# Patient Record
Sex: Female | Born: 1990 | Hispanic: No | Marital: Single | Smoking: Never smoker
Health system: Southern US, Community
[De-identification: ages and names within clinical notes are randomized; demographics above are authoritative.]

## PROBLEM LIST (undated history)

## (undated) ENCOUNTER — Inpatient Hospital Stay (HOSPITAL_COMMUNITY): Payer: Self-pay

## (undated) DIAGNOSIS — R519 Headache, unspecified: Secondary | ICD-10-CM

## (undated) DIAGNOSIS — R51 Headache: Secondary | ICD-10-CM

## (undated) DIAGNOSIS — J45909 Unspecified asthma, uncomplicated: Secondary | ICD-10-CM

## (undated) DIAGNOSIS — O139 Gestational [pregnancy-induced] hypertension without significant proteinuria, unspecified trimester: Secondary | ICD-10-CM

## (undated) DIAGNOSIS — B999 Unspecified infectious disease: Secondary | ICD-10-CM

## (undated) DIAGNOSIS — Z8619 Personal history of other infectious and parasitic diseases: Secondary | ICD-10-CM

## (undated) DIAGNOSIS — F419 Anxiety disorder, unspecified: Secondary | ICD-10-CM

## (undated) HISTORY — DX: Personal history of other infectious and parasitic diseases: Z86.19

---

## 2015-03-15 ENCOUNTER — Emergency Department (HOSPITAL_COMMUNITY)
Admission: EM | Admit: 2015-03-15 | Discharge: 2015-03-15 | Disposition: A | Discharge status: Home / Self Care | Payer: Medicaid Other | Attending: Emergency Medicine | Admitting: Emergency Medicine

## 2015-03-15 ENCOUNTER — Encounter (HOSPITAL_COMMUNITY): Payer: Self-pay | Admitting: Emergency Medicine

## 2015-03-15 DIAGNOSIS — K5900 Constipation, unspecified: Secondary | ICD-10-CM | POA: Insufficient documentation

## 2015-03-15 DIAGNOSIS — N760 Acute vaginitis: Secondary | ICD-10-CM | POA: Insufficient documentation

## 2015-03-15 DIAGNOSIS — R1030 Lower abdominal pain, unspecified: Secondary | ICD-10-CM | POA: Diagnosis present

## 2015-03-15 DIAGNOSIS — J45909 Unspecified asthma, uncomplicated: Secondary | ICD-10-CM | POA: Insufficient documentation

## 2015-03-15 DIAGNOSIS — B9689 Other specified bacterial agents as the cause of diseases classified elsewhere: Secondary | ICD-10-CM

## 2015-03-15 HISTORY — DX: Unspecified asthma, uncomplicated: J45.909

## 2015-03-15 LAB — I-STAT BETA HCG BLOOD, ED (MC, WL, AP ONLY): I-stat hCG, quantitative: <5 m[IU]/mL (ref <5)

## 2015-03-15 LAB — WET PREP, GENITAL
Trich, Wet Prep: NONE SEEN
WBC, Wet Prep HPF POC: NONE SEEN
Yeast Wet Prep HPF POC: NONE SEEN

## 2015-03-15 MED ORDER — METRONIDAZOLE 500 MG PO TABS: 500.0000 mg | ORAL_TABLET | Freq: Two times a day (BID) | ORAL | Status: DC

## 2015-03-15 NOTE — ED Provider Notes (Signed)
CSN: [id number]     Arrival date & time 03/15/15  0827 History   First MD Initiated Contact with Patient 03/15/15 [phone]     Chief Complaint  Patient presents with  . Abdominal Cramping     (Consider location/radiation/quality/duration/timing/severity/associated sxs/prior Treatment) HPI   [id number] p/w lower abdominal cramping, abnormal vaginal discharge x 1 week.  The abdominal pain is cramping, comes and goes, 5/10 intensity, has taken no medications for this.  Discharge is white and malodorous.  No OTC medications tried.  LMP 4 weeks ago was on time and normal.  Has unprotected sex with one partner, is not concerned for STDs at this time.  Has otherwise been mildly constipated, last BM was yesterday and was hard.  Has had some hot flashes.  Has hx c-section, no other abdominal surgeries.  Denies fevers, vomiting, urinary symptoms.   Past Medical History  Diagnosis Date  . Asthma    Past Surgical History  Procedure Laterality Date  . Cesarean section     No family history on file. History  Substance Use Topics  . Smoking status: Never Smoker   . Smokeless tobacco: Not on file  . Alcohol Use: Yes   OB History    No data available     Review of Systems  All other systems reviewed and are negative.     Allergies  Review of patient's allergies indicates no known allergies.  Home Medications   Prior to Admission medications   Not on File   BP 145/80 mmHg  Pulse 87  Temp(Src) 98 F (36.7 C) (Oral)  Resp 18  Ht  (1.575 m)  Wt 150 lb (68.04 kg)  BMI 27.43 kg/m2  SpO2 100%  LMP 02/15/2015 (Approximate) Physical Exam  Constitutional: She appears well-developed and well-nourished. No distress.  HENT:  Head: Normocephalic and atraumatic.  Neck: Neck supple.  Cardiovascular: Normal rate and regular rhythm.   Pulmonary/Chest: Effort normal and breath sounds normal. No respiratory distress. She has no wheezes. She has no rales.  Abdominal: Soft. She exhibits no  distension. There is no tenderness. There is no rebound and no guarding.  Genitourinary: Cervix exhibits no motion tenderness. Right adnexum displays no mass, no tenderness and no fullness. Left adnexum displays no mass, no tenderness and no fullness. No erythema, tenderness or bleeding in the vagina. No foreign body around the vagina. No signs of injury around the vagina. Vaginal discharge found.  Small amount thick white malodorous discharge in vagina.    Neurological: She is alert.  Skin: She is not diaphoretic.  Nursing note and vitals reviewed.   ED Course  Procedures (including critical care time) Labs Review Labs Reviewed  WET PREP, GENITAL - Abnormal; Notable for the following:    Clue Cells Wet Prep HPF POC MODERATE (*)    All other components within normal limits  HIV ANTIBODY (ROUTINE TESTING)  RPR  I-STAT BETA HCG BLOOD, ED (MC, WL, AP ONLY)  GC/CHLAMYDIA PROBE AMP ([place]) NOT AT [facility]    Imaging Review No results found.   EKG Interpretation None      MDM   Final diagnoses:  Bacterial vaginosis    Afebrile, nontoxic patient with crampy lower abdominal pain and abnormal vaginal discharge.  Pt has low concern for STDs.  NO e/o PID on exam.  Wet prep c/w BV.   D/C home with flagyl, health dept follow up and resources.   Discussed result, findings, treatment, and follow up  with patient.  Pt  given return precautions.  Pt verbalizes understanding and agrees with plan.         Trixie Dredge, PA-C 03/15/15 1006  Rolland Porter, MD 03/25/15 [phone]

## 2015-03-15 NOTE — ED Notes (Addendum)
Pt c/o abdominal cramping x 1 week. Pt also reports nausea, vaginal discharge and odor. LMP about 4 weeks ago.

## 2015-03-15 NOTE — ED Notes (Signed)
Pt reports having positive pregnancy test at home 3 days ago.

## 2015-03-15 NOTE — Discharge Instructions (Signed)
Read the information below.  Use the prescribed medication as directed.  Please discuss all new medications with your pharmacist.  You may return to the Emergency Department at any time for worsening condition or any new symptoms that concern you.  If you develop high fevers, abdominal pain, uncontrolled vomiting, or are unable to tolerate fluids by mouth, return to the ER for a recheck.     Bacterial Vaginosis Bacterial vaginosis is a vaginal infection that occurs when the normal balance of bacteria in the vagina is disrupted. It results from an overgrowth of certain bacteria. This is the most common vaginal infection in women of childbearing age. Treatment is important to prevent complications, especially in pregnant women, as it can cause a premature delivery. CAUSES  Bacterial vaginosis is caused by an increase in harmful bacteria that are normally present in smaller amounts in the vagina. Several different kinds of bacteria can cause bacterial vaginosis. However, the reason that the condition develops is not fully understood. RISK FACTORS Certain activities or behaviors can put you at an increased risk of developing bacterial vaginosis, including:  Having a new sex partner or multiple sex partners.  Douching.  Using an intrauterine device (IUD) for contraception. Women do not get bacterial vaginosis from toilet seats, bedding, swimming pools, or contact with objects around them. SIGNS AND SYMPTOMS  Some women with bacterial vaginosis have no signs or symptoms. Common symptoms include:  Grey vaginal discharge.  A fishlike odor with discharge, especially after sexual intercourse.  Itching or burning of the vagina and vulva.  Burning or pain with urination. DIAGNOSIS  Your health care provider will take a medical history and examine the vagina for signs of bacterial vaginosis. A sample of vaginal fluid may be taken. Your health care provider will look at this sample under a microscope to  check for bacteria and abnormal cells. A vaginal pH test may also be done.  TREATMENT  Bacterial vaginosis may be treated with antibiotic medicines. These may be given in the form of a pill or a vaginal cream. A second round of antibiotics may be prescribed if the condition comes back after treatment.  HOME CARE INSTRUCTIONS   Only take over-the-counter or prescription medicines as directed by your health care provider.  If antibiotic medicine was prescribed, take it as directed. Make sure you finish it even if you start to feel better.  Do not have sex until treatment is completed.  Tell all sexual partners that you have a vaginal infection. They should see their health care provider and be treated if they have problems, such as a mild rash or itching.  Practice safe sex by using condoms and only having one sex partner. SEEK MEDICAL CARE IF:   Your symptoms are not improving after 3 days of treatment.  You have increased discharge or pain.  You have a fever. MAKE SURE YOU:   Understand these instructions.  Will watch your condition.  Will get help right away if you are not doing well or get worse. FOR MORE INFORMATION  Centers for Disease Control and Prevention, Division of STD Prevention: SolutionApps.co.za American Sexual Health Association (ASHA): www.ashastd.org  Document Released: 09/13/2005 Document Revised: 07/04/2013 Document Reviewed: 04/25/2013 [facility] Patient Information 2015 [place], [place]. This information is not intended to replace advice given to you by your health care provider. Make sure you discuss any questions you have with your health care provider.    Emergency Department Resource Guide 1) Find a Doctor and Pay Out of  Pocket Although you won't have to find out who is covered by your insurance plan, it is a good idea to ask around and get recommendations. You will then need to call the office and see if the doctor you have chosen will accept you as a new  patient and what types of options they offer for patients who are self-pay. Some doctors offer discounts or will set up payment plans for their patients who do not have insurance, but you will need to ask so you aren't surprised when you get to your appointment.  2) Contact Your Local Health Department Not all health departments have doctors that can see patients for sick visits, but many do, so it is worth a call to see if yours does. If you don't know where your local health department is, you can check in your phone book. The CDC also has a tool to help you locate your state's health department, and many state websites also have listings of all of their local health departments.  3) Find a Walk-in Clinic If your illness is not likely to be very severe or complicated, you may want to try a walk in clinic. These are popping up all over the country in pharmacies, drugstores, and shopping centers. They're usually staffed by nurse practitioners or physician assistants that have been trained to treat common illnesses and complaints. They're usually fairly quick and inexpensive. However, if you have serious medical issues or chronic medical problems, these are probably not your best option.  No Primary Care Doctor: - Call Health Connect at  [phone] - they can help you locate a primary care doctor that  accepts your insurance, provides certain services, etc. - Physician Referral Service- [phone]  Chronic Pain Problems: Organization         Address  Phone   Notes  [facility]  [phone] Patients need to be referred by their primary care doctor.   Medication Assistance: Organization         Address  Phone   Notes  [facility] Medication [facility] [address] [place] [place], [place] [address] [phone] --Must be a resident of [facility] -- Must have NO insurance coverage whatsoever (no Medicaid/ Medicare, etc.) -- The pt. MUST have a primary  care doctor that directs their care regularly and follows them in the community   [facility]ist  [phone]   [employer]  [phone]    Agencies that provide inexpensive medical care: Organization         Address  Phone   Notes  [facility]  [phone]   [facility]    [phone]   [facility] [address] [place], [place] [address] [phone]   [facility] [address], [place] [phone]   Planned Parenthood    [phone]   [facility]    [phone]   [facility]  [address], [place] Phone:  [phone], Fax:  [phone] Hours of Operation:  9 am - 6 pm, M-F.  Also accepts Medicaid/Medicare and self-pay.  [facility]  [address], [place] Phone: [phone], Fax: [phone]. Hours of Operation:  8:30 am - 5:30 pm, M-F.  Also accepts Medicaid and self-pay.  [facility] [place] [address], [employer] Phone: [phone]   [facility] [address] [place], [place] [phone], Ext. [phone] Mondays & Thursdays: 7-9 AM.  First 15 patients are seen on a first come, first serve basis.    Medicaid-accepting [facility] Providers:  Organization         Address  Phone   Notes  [facility] [address], [place] [phone] Also accepts self-pay patients.  [facility] [address] Laurell Josephs [place], [place]  [phone]   [facility] [address], [place] [phone]   [facility] [address], [place] [phone]   Renaye Rakers [address], [place]   [phone] Only accepts [place] patients after they have their name applied to their card.   Self-Pay (no insurance) in [facility]:  Organization         Address  Phone   Notes  Sickle Cell Patients, [facility] [address] [place], [place] [phone]   [facility] [address] [place], [place] [phone]   [facility] [place]  [address], [place] [phone]   [facility]/Dr. Osei-Bonsu  [address], [place] or [address], [place] [phone] Phone number for both [place] and [place] locations is the same.  [facility] [address], [place] [phone]   [facility] [address] [place] or [address] [phone]   [facility] [address] [place] [phone], phone; [phone], fax Sees patients 1st and 3rd Saturday of every month.  Must not qualify for public or private insurance (i.e. Medicaid, Medicare, [place] Health Choice, Veterans' Benefits)  Household income should be no more than 200% of the poverty level The clinic cannot treat you if you are pregnant or think you are pregnant  Sexually transmitted diseases are not treated at the clinic.    Dental Care: Organization         Address  Phone  Notes  [facility] of [facility] [address] [place], [place] [phone] Accepts children up to age 99 who are enrolled in [place] or [place] Health Choice; pregnant women with a Medicaid card; and children who have applied for Medicaid or [place] Health Choice, but were declined, whose parents can pay a reduced fee at time of service.  [facility] Department of [facility]  [address], [place] [phone] Accepts children up to age 5 who are enrolled in [place] or [place] Health Choice; pregnant women with a Medicaid card; and children who have applied for Medicaid or  Health Choice, but were declined, whose parents can  pay a reduced fee at time of service.  [facility]  [address] [place], [place] [phone] Patients are seen by appointment only. Walk-ins are not accepted. [facility] will see patients 67 years of age and older. Monday - Tuesday (8am-5pm) Most Wednesdays (8:30-5pm) $30 per visit, cash only  [facility] Adult Dental Access PRO[facility]  [address], [facility] [phone] Patients are seen by appointment only. Walk-ins are not accepted. [facility] will see patients 57 years of age and older. One Wednesday Evening (Monthly: Volunteer Based).  $30 per visit, cash only  [employer] of Dentistry Clinics  [phone] for adults; Children under age 47, call Graduate Pediatric Dentistry at [phone]. Children aged 63-14, please call [phone] to request a pediatric application.  Dental services are provided in all areas of dental care including fillings, crowns and bridges, complete and partial dentures, implants, gum treatment, root canals, and extractions. Preventive care is also provided. Treatment is provided to both adults and children. Patients are selected via a lottery and there is often a waiting list.   [facility] [address], [place]  [phone] [email or web address]   [facility] [address] [place], [place] [phone], Ext. [phone] Second and Fourth Thursday of each month, opens at 6:30 AM; Clinic ends at 9 AM.  Patients are seen on a first-come first-served basis, and a limited number are seen during each clinic.   [facility]  [address] Ether Griffins [place], [place] [phone]   Eligibility Requirements You must have lived in [place], [place], or [place] counties for at least the last three months.   You cannot be eligible for state or federal sponsored [place], including [employer], [place], or [employer].   You generally cannot be eligible for healthcare  insurance through your employer.    How to apply: Eligibility screenings are held every Tuesday and Wednesday afternoon from 1:00 pm until 4:00 pm. You do not need an appointment for the interview!  [facility] [address], [place], [place] [phone]   [facility] Health Department  [phone]   [facility] Health Department  [phone]   [facility] Health Department  [phone]    Behavioral Health Resources in the Community: Intensive Outpatient Programs Organization         Address  Phone  Notes  [facility] [address] [place], [place] [phone]   [facility] [address] [place], [place] [phone]   [facility] [address] [place], [place]  [phone]   [facility] [address],  [place], [place] [phone] or [phone]   Substance Abuse Resources Organization         Address  Phone  Notes  [facility] Drug Services  [phone]   [facility]  [phone]   [facility] [place]  [phone]   Floydene Flock  [phone]   Residential & Outpatient Substance Abuse Program  [phone]   Psychological Services Organization         Address  Phone  Notes  [facility]  [phone]   [facility]  [phone]   [facility] [address] [place] [phone] or [phone]    Mobile Crisis Teams Organization         Address  Phone  Notes  [facility], Mobile Crisis Care Unit  [phone]   [facility]  [address] [place], [place] [phone]   Doristine Locks [address] [place] [phone]    Self-Help/Support Groups Organization         Address  Phone             Notes  [facility] [place] - variety of support groups  [phone]- I7437963 Call for more information  Narcotics Anonymous (NA),  [facility] [address], [employer]  2 meetings at this location   Statistician  Address  Phone  Notes  [facility] [address],    [place]  [phone]   [facility]  [address], [place] [phone], [place], [place] [phone]   [facility] [address], [place] [phone] Admissions: 8am-3pm M-F  [facility] [address]    [place], [place] [phone]   [facility] [address], [place], [place] [phone]   [facility] [address]  [place], [place] [phone]   [facility] - Intensive Outpatient [address], [place], [place] [phone]   [facility] ([facility] [address]  [place], [place] [phone] or [phone]   [facility] ([facility]) [address] [place], [place] [phone] Accepts Medicaid  [facility] [address]  [place] [phone] Substance Abuse/Addiction Treatment   [facility] Behavioral Health Resources Organization         Address  Phone  Notes  [facility]  [phone]) [phone]   Angie FavaJulie Brannon, PhD [address] [place], [place]   [phone]) [phone] or [phone](336) [phone]   [facility]   [address] [place] [address] [place], [place] [phone](336) [phone]   [facility] [address], [place], [place] [phone]) [phone] Insurance/Medicaid/sponsorship through [facility]  [facility] [address]                                    [place], [place] [phone]) [phone] Therapy/tele-psych/case  [facility] [address].   [place], [place] [phone](336) [phone]    Dr. Lolly MustacheArfeen  [phone](336) [phone]   [facility]  [employer] [facility] 1) [address], [place] 2) [address], [place] 3)  [address], [place] [phone]) [phone]  [phone]) [phone]    [facility] Child Abuse Hotline [phone](336) [phone] or [phone](336) [phone] (After Hours)

## 2015-03-16 LAB — HIV ANTIBODY (ROUTINE TESTING W REFLEX): HIV Screen 4th Generation wRfx: NONREACTIVE

## 2015-03-16 LAB — RPR: RPR Ser Ql: NONREACTIVE

## 2015-03-17 LAB — GC/CHLAMYDIA PROBE AMP (~~LOC~~) NOT AT ARMC
Chlamydia: NEGATIVE
Neisseria Gonorrhea: NEGATIVE

## 2015-06-29 ENCOUNTER — Encounter (HOSPITAL_COMMUNITY): Payer: Self-pay | Admitting: Emergency Medicine

## 2015-06-29 ENCOUNTER — Emergency Department (HOSPITAL_COMMUNITY)
Admission: EM | Admit: 2015-06-29 | Discharge: 2015-06-29 | Disposition: A | Discharge status: Home / Self Care | Payer: Medicaid Other | Attending: Emergency Medicine | Admitting: Emergency Medicine

## 2015-06-29 DIAGNOSIS — J45901 Unspecified asthma with (acute) exacerbation: Secondary | ICD-10-CM | POA: Diagnosis not present

## 2015-06-29 DIAGNOSIS — J45990 Exercise induced bronchospasm: Secondary | ICD-10-CM

## 2015-06-29 DIAGNOSIS — Z760 Encounter for issue of repeat prescription: Secondary | ICD-10-CM | POA: Diagnosis not present

## 2015-06-29 DIAGNOSIS — Z79899 Other long term (current) drug therapy: Secondary | ICD-10-CM | POA: Insufficient documentation

## 2015-06-29 DIAGNOSIS — Z792 Long term (current) use of antibiotics: Secondary | ICD-10-CM | POA: Insufficient documentation

## 2015-06-29 DIAGNOSIS — J45909 Unspecified asthma, uncomplicated: Secondary | ICD-10-CM | POA: Diagnosis present

## 2015-06-29 MED ORDER — ALBUTEROL SULFATE HFA 108 (90 BASE) MCG/ACT IN AERS: 2.0000 | INHALATION_SPRAY | Freq: Four times a day (QID) | RESPIRATORY_TRACT | Status: AC | PRN

## 2015-06-29 NOTE — ED Notes (Signed)
Pt from home c/o shortness of breath when working out at the gym. She denies shortness of breath now. 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 Lung sounds clear bilaterally. She would like a prescription for an inhaler today.

## 2015-06-29 NOTE — ED Provider Notes (Signed)
CSN: [id number]     Arrival date & time 06/29/15  4098 History   First MD Initiated Contact with Patient 06/29/15 [phone]     Chief Complaint  Patient presents with  . Asthma  . Medication Refill    Patient is a 24 y.o. female presenting with asthma. The history is provided by the patient.  Asthma   patient presents to the emergency room with complaints of shortness of breath when exercising. Patient has a history of exercise-induced asthma. She says she's had this problem ever since she was child. She moved to [place] does not have a primary care doctor. She used to use an albuterol inhaler but does not have one right now. Yesterday when she was working out at [employer] she began to feel very short of breath. She felt like she was wheezing. Symptoms were severe and she felt like she was going to pass out. SYMPTOMS subsequently resolved with rest. She has not had nay shortness of breath since then. This morning she feels fine but she plans on going to the gym again and would like to get an albuterol inhaler. She denies any trouble with chest pain. She is not feeling short of breath now. She has not had any trouble with fevers or coughing. She does have a history of anxiety and sometimes feels short of breath when she gets anxious but she felt like she was wheezing yesterday at the gym.   Past Medical History  Diagnosis Date  . Asthma   No history of pulmonary embolism or DVT.  Past Surgical History  Procedure Laterality Date  . Cesarean section     No family history on file. Social History  Substance Use Topics  . Smoking status: Never Smoker   . Smokeless tobacco: None  . Alcohol Use: Yes   OB History    No data available     Review of Systems  Constitutional: Negative for fever.  Musculoskeletal:       No leg swelling  All other systems reviewed and are negative.     Allergies  Review of patient's allergies indicates no known allergies.  Home Medications   Prior to  Admission medications   Medication Sig Start Date End Date Taking? Authorizing Provider  albuterol (PROVENTIL HFA;VENTOLIN HFA) 108 (90 BASE) MCG/ACT inhaler Inhale 2 puffs into the lungs every 6 (six) hours as needed for wheezing or shortness of breath. 06/29/15   Linwood Dibbles, MD  metroNIDAZOLE (FLAGYL) 500 MG tablet Take 1 tablet (500 mg total) by mouth 2 (two) times daily. 03/15/15   Trixie Dredge, PA-C   BP 120/61 mmHg  Pulse 78  Temp(Src) 98.2 F (36.8 C) (Oral)  Resp 18  SpO2 100%  LMP 06/29/2015 Physical Exam  Constitutional: She appears well-developed and well-nourished. No distress.  HENT:  Head: Normocephalic and atraumatic.  Right Ear: External ear normal.  Left Ear: External ear normal.  Eyes: Conjunctivae are normal. Right eye exhibits no discharge. Left eye exhibits no discharge. No scleral icterus.  Neck: Neck supple. No tracheal deviation present.  Cardiovascular: Normal rate, regular rhythm and intact distal pulses.   Pulmonary/Chest: Effort normal and breath sounds normal. No stridor. No respiratory distress. She has no wheezes. She has no rales.  Abdominal: Soft. Bowel sounds are normal. She exhibits no distension. There is no tenderness. There is no rebound and no guarding.  Musculoskeletal: She exhibits no edema or tenderness.  Neurological: She is alert. She has normal strength. No cranial nerve  deficit (no facial droop, extraocular movements intact, no slurred speech) or sensory deficit. She exhibits normal muscle tone. She displays no seizure activity. Coordination normal.  Skin: Skin is warm and dry. No rash noted.  Psychiatric: She has a normal mood and affect.  Nursing note and vitals reviewed.   ED Course  Procedures (including critical care time) Labs Review Labs Reviewed - No data to display  Imaging Review No results found. I have personally reviewed and evaluated these images and lab results as part of my medical decision-making.   EKG  Interpretation None      MDM   Final diagnoses:  Exercise-induced asthma    Patient is asymptomatic today. She's not having any complaints of chest pain or shortness of breath. Her exam is normal. Vital signs show a normal oxygen saturation respiratory rate and pulse.  I will give her a prescription for an albuterol inhaler refill. Recommend she follow up with primary care doctor to establish care. She should return to the emergency room for worsening symptoms, chest pain or shortness of breath.    Linwood Dibbles, MD 06/29/15 [phone]

## 2015-06-29 NOTE — ED Notes (Signed)
Patient is not in any distress. Patient walked to the room from triage back to room 25 without any difficulty.

## 2015-06-29 NOTE — Discharge Instructions (Signed)
Exercise-Induced Bronchospasm A bronchospasm is a condition that is commonly caused by exercise, in which the muscles around the bronchioles (airways to the lungs) tighten, causing the airway to constrict. Exercise-induced bronchospasms are usually associated with short periods of vigorous activity. Many people who experience an exercise-induced bronchospasm may not notice at the time of the event; however, the athlete may later experience symptoms that negatively affect training and performance. SYMPTOMS   High-pitched sounds with breathing (wheezing).  Coughing.  Increased work of breathing (dyspnea).  Rapid breathing (hyperventilation).  Chest pain.  Symptoms occurring 4 to 6 hours after exercise is completed (late-phase reaction). CAUSES  It is not known why certain individuals experience bronchospasms. Respiratory specialists currently think that the cool or dry air breathed in may cause damage to the lining of the bronchioles, which elicits an inflammatory response. The inflammation causes the airways to narrow and symptoms then occur. RISK INCREASES WITH:  Viral infections.  Exercise in cold air.  Exercise in dry conditions.  Poor physical fitness.  High-intensity exercise.  No warm-up before play.  Frequent exposure to substances that produce allergic reactions (allergens). PREVENTION   Improve conditioning.  Treat allergies.  Breathe warm air (cover mouth and nose with a towel or scarf).  Warm up for an appropriate period of time before physical activity.  Gradually decrease intensity (warm down) for an appropriate period of time after physical activity. PROGNOSIS  Most people with exercise-induced asthma respond well to medication. Patients are typically prescribed an inhaler to treat bronchospasms. However, if symptoms persist despite treatment and continue to affect performance, individuals may need to consider avoiding activities that produce  symptoms. RELATED COMPLICATIONS   Decreased athletic performance.  Inability to condition as well as expected.  Side effects from medications. TREATMENT   Maintain physical fitness.  Run with a scarf or towel over your mouth in cold, dry air.  Complete at least 10 minutes of warm-up before high-intensity exercise.  Warm down after play.  Treat allergies. MEDICATION  The usual initial medication is an albuterol inhaler, which expands the constricted bronchioles.  The second-line medication is inhaled corticosteroids, which reduce inflammation in the airway.  Alternative medications included sodium cromoglycate and nedocromil inhalers.  Long-acting medications such as salmeterol can also be used as second-line medications. ACTIVITY  If medications are able to treat the offending symptoms, then no activity modification is required. If you know you will be training or competing in cold or dry climates take extra precautions to prevent symptoms. DIET  No specific diet is recommended.  SEEK MEDICAL CARE IF:   Greater than normal fatigue with exercise.  Greater than normal difficulty breathing occurs with exercise.  Increased wheezing with exercise.  You appear to be breathing harder and faster than expected with training.  Allergies appear to be uncontrollable.  You experience chest pain with exercise. Document Released: 09/13/2005 Document Revised: 01/28/2014 Document Reviewed: 12/26/2008 [facility] Patient Information 2015 [place], [place]. This information is not intended to replace advice given to you by your health care provider. Make sure you discuss any questions you have with your health care provider.

## 2015-06-30 ENCOUNTER — Encounter (HOSPITAL_COMMUNITY): Payer: Self-pay | Admitting: Emergency Medicine

## 2015-06-30 DIAGNOSIS — Z3202 Encounter for pregnancy test, result negative: Secondary | ICD-10-CM | POA: Diagnosis not present

## 2015-06-30 DIAGNOSIS — R51 Headache: Secondary | ICD-10-CM | POA: Insufficient documentation

## 2015-06-30 DIAGNOSIS — J45909 Unspecified asthma, uncomplicated: Secondary | ICD-10-CM | POA: Diagnosis not present

## 2015-06-30 DIAGNOSIS — Z79899 Other long term (current) drug therapy: Secondary | ICD-10-CM | POA: Insufficient documentation

## 2015-06-30 NOTE — ED Notes (Signed)
Pt reports headache with light sensitivity and nausea x 5 hours.

## 2015-06-30 NOTE — ED Notes (Signed)
Pt took tylenol 1 hour pta.

## 2015-07-01 ENCOUNTER — Emergency Department (HOSPITAL_COMMUNITY)
Admission: EM | Admit: 2015-07-01 | Discharge: 2015-07-01 | Disposition: A | Discharge status: Home / Self Care | Payer: Medicaid Other | Attending: Emergency Medicine | Admitting: Emergency Medicine

## 2015-07-01 DIAGNOSIS — R519 Headache, unspecified: Secondary | ICD-10-CM

## 2015-07-01 DIAGNOSIS — R51 Headache: Secondary | ICD-10-CM

## 2015-07-01 LAB — I-STAT BETA HCG BLOOD, ED (MC, WL, AP ONLY): I-stat hCG, quantitative: <5 m[IU]/mL (ref <5)

## 2015-07-01 MED ORDER — METOCLOPRAMIDE HCL 10 MG PO TABS
10.0000 mg | ORAL_TABLET | Freq: Once | ORAL | Status: AC
  Administered 2015-07-01: 10 mg via ORAL
  Filled 2015-07-01: qty 1

## 2015-07-01 MED ORDER — KETOROLAC TROMETHAMINE 60 MG/2ML IM SOLN
60.0000 mg | Freq: Once | INTRAMUSCULAR | Status: AC
  Administered 2015-07-01: 60 mg via INTRAMUSCULAR
  Filled 2015-07-01: qty 2

## 2015-07-01 MED ORDER — DIPHENHYDRAMINE HCL 25 MG PO CAPS
50.0000 mg | ORAL_CAPSULE | Freq: Once | ORAL | Status: AC
  Administered 2015-07-01: 50 mg via ORAL
  Filled 2015-07-01: qty 2

## 2015-07-01 MED ORDER — ALBUTEROL SULFATE HFA 108 (90 BASE) MCG/ACT IN AERS
2.0000 | INHALATION_SPRAY | Freq: Once | RESPIRATORY_TRACT | Status: AC
  Administered 2015-07-01: 2 via RESPIRATORY_TRACT
  Filled 2015-07-01: qty 6.7

## 2015-07-01 NOTE — Discharge Instructions (Signed)
General Headache Without Cause Ms. Vasques, take ibuprofen  every 8 hours as needed for headache.  See a primary care doctor within 3 days for close follow up.  If symptoms worsen, come back to the ED immediately. Thank you. A general headache is pain or discomfort felt around the head or neck area. The cause may not be found.  HOME CARE   Keep all doctor visits.  Only take medicines as told by your doctor.  Lie down in a dark, quiet room when you have a headache.  Keep a journal to find out if certain things bring on headaches. For example, write down:  What you eat and drink.  How much sleep you get.  Any change to your diet or medicines.  Relax by getting a massage or doing other relaxing activities.  Put ice or heat packs on the head and neck area as told by your doctor.  Lessen stress.  Sit up straight. Do not tighten (tense) your muscles.  Quit smoking if you smoke.  Lessen how much alcohol you drink.  Lessen how much caffeine you drink, or stop drinking caffeine.  Eat and sleep on a regular schedule.  Get 7 to 9 hours of sleep, or as told by your doctor.  Keep lights dim if bright lights bother you or make your headaches worse. GET HELP RIGHT AWAY IF:   Your headache becomes really bad.  You have a fever.  You have a stiff neck.  You have trouble seeing.  Your muscles are weak, or you lose muscle control.  You lose your balance or have trouble walking.  You feel like you will pass out (faint), or you pass out.  You have really bad symptoms that are different than your first symptoms.  You have problems with the medicines given to you by your doctor.  Your medicines do not work.  Your headache feels different than the other headaches.  You feel sick to your stomach (nauseous) or throw up (vomit). MAKE SURE YOU:   Understand these instructions.  Will watch your condition.  Will get help right away if you are not doing well or get  worse. Document Released: 06/22/2008 Document Revised: 12/06/2011 Document Reviewed: 09/03/2011 [facility] Patient Information 2015 [place], [place]. This information is not intended to replace advice given to you by your health care provider. Make sure you discuss any questions you have with your health care provider.

## 2015-07-01 NOTE — ED Provider Notes (Signed)
CSN: [id number]     Arrival date & time 06/30/15  2118 History  By signing my name below, I, Kim [name], attest that this documentation has been prepared under the direction and in the presence of Tomasita Crumble, MD. Electronically Signed: Sonum [name], Neurosurgeon. 07/01/2015. 2:29 AM.    Chief Complaint  Patient presents with  . Headache    The history is provided by the patient. No language interpreter was used.     HPI Comments: Kim [name] is a 24 y.o. female who presents to the Emergency Department complaining of a gradual onset, constant, unchanged HA that began several hours ago. She reports increased stress levels at home which she believes is related to her current symptoms. She has taken Tylenol without significant relief. She denies SOB, dizziness, lightheadedness. She denies any neurological symptoms. Patient has no further complaints.  Past Medical History  Diagnosis Date  . Asthma    Past Surgical History  Procedure Laterality Date  . Cesarean section     No family history on file. Social History  Substance Use Topics  . Smoking status: Never Smoker   . Smokeless tobacco: None  . Alcohol Use: Yes   OB History    No data available     Review of Systems  10 Systems reviewed and all are negative for acute change except as noted in the HPI.   Allergies  Review of patient's allergies indicates no known allergies.  Home Medications   Prior to Admission medications   Medication Sig Start Date End Date Taking? Authorizing Provider  albuterol (PROVENTIL HFA;VENTOLIN HFA) 108 (90 BASE) MCG/ACT inhaler Inhale 2 puffs into the lungs every 6 (six) hours as needed for wheezing or shortness of breath. 06/29/15  Yes Linwood Dibbles, MD   BP 133/91 mmHg  Pulse 73  Temp(Src) 98.6 F (37 C) (Oral)  Resp 16  Ht  (1.549 m)  Wt 155 lb 4.8 oz (70.444 kg)  BMI 29.36 kg/m2  SpO2 100%  LMP 06/29/2015 Physical Exam  Constitutional: She is oriented to person, place, and time. She  appears well-developed and well-nourished. No distress.  HENT:  Head: Normocephalic and atraumatic.  Nose: Nose normal.  Mouth/Throat: Oropharynx is clear and moist. No oropharyngeal exudate.  Eyes: Conjunctivae and EOM are normal. Pupils are equal, round, and reactive to light. No scleral icterus.  Neck: Normal range of motion. Neck supple. No JVD present. No tracheal deviation present. No thyromegaly present.  Cardiovascular: Normal rate, regular rhythm and normal heart sounds.  Exam reveals no gallop and no friction rub.   No murmur heard. Pulmonary/Chest: Effort normal and breath sounds normal. No respiratory distress. She has no wheezes. She exhibits no tenderness.  Abdominal: Soft. Bowel sounds are normal. She exhibits no distension and no mass. There is no tenderness. There is no rebound and no guarding.  Musculoskeletal: Normal range of motion. She exhibits no edema or tenderness.  Lymphadenopathy:    She has no cervical adenopathy.  Neurological: She is alert and oriented to person, place, and time. No cranial nerve deficit. She exhibits normal muscle tone. Coordination normal.  Normal strength and sensation in all 4 extremities. Normal cerebellar testing.   Skin: Skin is warm and dry. No rash noted. No erythema. No pallor.  Nursing note and vitals reviewed.   ED Course  Procedures (including critical care time)  DIAGNOSTIC STUDIES: Oxygen Saturation is 100% on RA, normal by my interpretation.    COORDINATION OF CARE: 12:25 AM Discussed treatment plan  with pt at bedside and pt agreed to plan.  2:20 AM Patient updated- Upon repeat evaluation, patient states her headache is gone. She was advised on ibuprofen at home as needed.  PCP fu was recommended.  She appears well and in NAD.  Her VS remain within her normal limits and she is safe for DC.   Labs Review Labs Reviewed - No data to display  Imaging Review No results found.    EKG Interpretation None      MDM    Final diagnoses:  None    Patient presents to the emergency department for headache which she states is consistent with her anxiety. She denies it being acute in onset.  I have low concern for serious intracranial pathology. Patient was given Toradol, Reglan, Benadryl for her symptoms. We'll observe the emergency department for improvement.   Patient feels better after medications. Encouraged to continue Motrin at home as needed. She appears well in acute distress, vital signs remain within her normal limits and she is safe for discharge.  I personally performed the services described in this documentation, which was scribed in my presence. The recorded information has been reviewed and is accurate.   Tomasita Crumble, MD 07/01/15 [phone]

## 2016-02-09 ENCOUNTER — Emergency Department (HOSPITAL_COMMUNITY): Payer: Self-pay

## 2016-02-09 ENCOUNTER — Encounter (HOSPITAL_COMMUNITY): Payer: Self-pay | Admitting: Emergency Medicine

## 2016-02-09 ENCOUNTER — Emergency Department (HOSPITAL_COMMUNITY)
Admission: EM | Admit: 2016-02-09 | Discharge: 2016-02-09 | Disposition: A | Discharge status: Home / Self Care | Payer: Self-pay | Attending: Emergency Medicine | Admitting: Emergency Medicine

## 2016-02-09 ENCOUNTER — Emergency Department (HOSPITAL_COMMUNITY)
Admission: EM | Admit: 2016-02-09 | Discharge: 2016-02-09 | Disposition: A | Discharge status: Home / Self Care | Payer: Medicaid Other | Attending: Dermatology | Admitting: Dermatology

## 2016-02-09 DIAGNOSIS — R109 Unspecified abdominal pain: Secondary | ICD-10-CM | POA: Insufficient documentation

## 2016-02-09 DIAGNOSIS — Z7951 Long term (current) use of inhaled steroids: Secondary | ICD-10-CM | POA: Insufficient documentation

## 2016-02-09 DIAGNOSIS — O26851 Spotting complicating pregnancy, first trimester: Secondary | ICD-10-CM | POA: Insufficient documentation

## 2016-02-09 DIAGNOSIS — J45909 Unspecified asthma, uncomplicated: Secondary | ICD-10-CM | POA: Insufficient documentation

## 2016-02-09 DIAGNOSIS — Z3A01 Less than 8 weeks gestation of pregnancy: Secondary | ICD-10-CM | POA: Insufficient documentation

## 2016-02-09 DIAGNOSIS — Z3A00 Weeks of gestation of pregnancy not specified: Secondary | ICD-10-CM | POA: Insufficient documentation

## 2016-02-09 DIAGNOSIS — O26891 Other specified pregnancy related conditions, first trimester: Secondary | ICD-10-CM | POA: Insufficient documentation

## 2016-02-09 DIAGNOSIS — O26859 Spotting complicating pregnancy, unspecified trimester: Secondary | ICD-10-CM | POA: Insufficient documentation

## 2016-02-09 DIAGNOSIS — N939 Abnormal uterine and vaginal bleeding, unspecified: Secondary | ICD-10-CM

## 2016-02-09 DIAGNOSIS — Z3490 Encounter for supervision of normal pregnancy, unspecified, unspecified trimester: Secondary | ICD-10-CM

## 2016-02-09 DIAGNOSIS — Z5321 Procedure and treatment not carried out due to patient leaving prior to being seen by health care provider: Secondary | ICD-10-CM | POA: Insufficient documentation

## 2016-02-09 LAB — I-STAT BETA HCG BLOOD, ED (MC, WL, AP ONLY)
I-stat hCG, quantitative: 1402.9 m[IU]/mL — ABNORMAL HIGH (ref <5)
I-stat hCG, quantitative: 1310.6 m[IU]/mL — ABNORMAL HIGH (ref <5)

## 2016-02-09 LAB — URINALYSIS, ROUTINE W REFLEX MICROSCOPIC
Bilirubin Urine: NEGATIVE
Glucose, UA: NEGATIVE mg/dL
HGB URINE DIPSTICK: NEGATIVE
KETONES UR: NEGATIVE mg/dL
LEUKOCYTES UA: NEGATIVE
Nitrite: NEGATIVE
Protein, ur: NEGATIVE mg/dL
Specific Gravity, Urine: 1.035 — ABNORMAL HIGH (ref 1.005–1.030)
pH: 6 (ref 5.0–8.0)

## 2016-02-09 LAB — CBC WITH DIFFERENTIAL/PLATELET
BASOS ABS: 0 10*3/uL (ref 0.0–0.1)
Basophils Relative: 1 %
Eosinophils Absolute: 0.1 10*3/uL (ref 0.0–0.7)
Eosinophils Relative: 1 %
HEMATOCRIT: 38.6 % (ref 36.0–46.0)
Hemoglobin: 13.2 g/dL (ref 12.0–15.0)
LYMPHS PCT: 39 %
Lymphs Abs: 2.9 10*3/uL (ref 0.7–4.0)
MCH: 28.4 pg (ref 26.0–34.0)
MCHC: 34.2 g/dL (ref 30.0–36.0)
MCV: 83 fL (ref 78.0–100.0)
Monocytes Absolute: 0.6 10*3/uL (ref 0.1–1.0)
Monocytes Relative: 8 %
NEUTROS ABS: 3.8 10*3/uL (ref 1.7–7.7)
NEUTROS PCT: 51 %
Platelets: 318 10*3/uL (ref 150–400)
RBC: 4.65 MIL/uL (ref 3.87–5.11)
RDW: 12.9 % (ref 11.5–15.5)
WBC: 7.3 10*3/uL (ref 4.0–10.5)

## 2016-02-09 LAB — WET PREP, GENITAL
Clue Cells Wet Prep HPF POC: NONE SEEN
Sperm: NONE SEEN
Trich, Wet Prep: NONE SEEN
Yeast Wet Prep HPF POC: NONE SEEN

## 2016-02-09 LAB — APTT: aPTT: 28 seconds (ref 24–37)

## 2016-02-09 LAB — PROTIME-INR
INR: 1.07 (ref 0.00–1.49)
PROTHROMBIN TIME: 14.1 s (ref 11.6–15.2)

## 2016-02-09 LAB — TYPE AND SCREEN
ABO/RH(D): O POS
Antibody Screen: NEGATIVE

## 2016-02-09 LAB — ABO/RH: ABO/RH(D): O POS

## 2016-02-09 NOTE — ED Notes (Signed)
Pt c/o light spotting and abdominal cramping since this morning. Pt reports having positive pregnancy test on Saturday.

## 2016-02-09 NOTE — ED Provider Notes (Signed)
CSN: [id number]     Arrival date & time 02/09/16  1404 History   First MD Initiated Contact with Patient 02/09/16 1613     Chief Complaint  Patient presents with  . Vaginal Bleeding     (Consider location/radiation/quality/duration/timing/severity/associated sxs/prior Treatment) Patient is a 25 y.o. female presenting with vaginal bleeding. The history is provided by the patient.  Vaginal Bleeding Quality:  Lighter than menses (light pink) Severity:  Mild Onset quality:  Sudden Duration:  8 hours Timing:  Intermittent Progression:  Unchanged Chronicity:  New Possible pregnancy: yes   Context: at rest   Relieved by:  Nothing Worsened by:  Nothing tried Ineffective treatments:  None tried Associated symptoms: abdominal pain and nausea   Associated symptoms: no dizziness, no dysuria, no fever and no vaginal discharge   Risk factors: prior miscarriage    Kim [name] is a [id number] 25 y.o. female with PMH significant for asthma who presents with sudden onset, intermittent, light pink vaginal bleeding since 8 AM this morning.  Patient reports she took a pregnancy test Saturday, and it was positive.  Denies passing of clots or tissue.  LMP January 11, 2016.  No prior treatment.  She states she has not used any pads or tampons because it has not been that heavy.  Denies fever, chills, syncope, lightheadedness, dizziness, CP, SOB, vomiting, diarrhea, urinary symptoms, or vaginal/pelvic pain.  She endorses some nausea.    Past Medical History  Diagnosis Date  . Asthma    Past Surgical History  Procedure Laterality Date  . Cesarean section     History reviewed. No pertinent family history. Social History  Substance Use Topics  . Smoking status: Never Smoker   . Smokeless tobacco: None  . Alcohol Use: Yes   OB History    No data available     Review of Systems  Constitutional: Negative for fever and chills.  Respiratory: Negative for shortness of breath.   Cardiovascular: Negative  for chest pain.  Gastrointestinal: Positive for nausea and abdominal pain. Negative for vomiting and diarrhea.  Genitourinary: Positive for vaginal bleeding. Negative for dysuria, hematuria, vaginal discharge, vaginal pain and pelvic pain.  Neurological: Negative for dizziness.  All other systems reviewed and are negative.     Allergies  Review of patient's allergies indicates no known allergies.  Home Medications   Prior to Admission medications   Medication Sig Start Date End Date Taking? Authorizing Provider  albuterol (PROVENTIL HFA;VENTOLIN HFA) 108 (90 BASE) MCG/ACT inhaler Inhale 2 puffs into the lungs every 6 (six) hours as needed for wheezing or shortness of breath. 06/29/15  Yes Linwood Dibbles, MD   BP 124/64 mmHg  Pulse 74  Temp(Src) 98.8 F (37.1 C) (Oral)  Resp 14  SpO2 100%  LMP 01/11/2016 Physical Exam  Constitutional: She is oriented to person, place, and time. She appears well-developed and well-nourished.  Non-toxic appearance. She does not have a sickly appearance. She does not appear ill.  HENT:  Head: Normocephalic and atraumatic.  Mouth/Throat: Oropharynx is clear and moist.  Eyes: Conjunctivae are normal. Pupils are equal, round, and reactive to light.  Neck: Normal range of motion. Neck supple.  Cardiovascular: Normal rate and regular rhythm.   Pulmonary/Chest: Effort normal and breath sounds normal. No accessory muscle usage or stridor. No respiratory distress. She has no wheezes. She has no rhonchi. She has no rales.  Abdominal: Soft. Bowel sounds are normal. She exhibits no distension. There is tenderness (mild ) in the suprapubic area. There  is no rebound and no guarding.  Genitourinary: Vagina normal and uterus normal. Cervix exhibits no motion tenderness, no discharge and no friability. Right adnexum displays no tenderness. Left adnexum displays no tenderness. No erythema, tenderness or bleeding in the vagina. No signs of injury around the vagina. No vaginal  discharge found.  Chaperone present. Cervical os closed. No blood in the vaginal vault. No discharge.  No bilateral adnexal tenderness.  Mild uterine tenderness.   Musculoskeletal: Normal range of motion.  Lymphadenopathy:    She has no cervical adenopathy.  Neurological: She is alert and oriented to person, place, and time.  Speech clear without dysarthria.  Skin: Skin is warm and dry.  Psychiatric: She has a normal mood and affect. Her behavior is normal.    ED Course  Procedures (including critical care time) Labs Review Labs Reviewed  WET PREP, GENITAL - Abnormal; Notable for the following:    WBC, Wet Prep HPF POC MODERATE (*)    All other components within normal limits  URINALYSIS, ROUTINE W REFLEX MICROSCOPIC (NOT AT [facility]) - Abnormal; Notable for the following:    APPearance CLOUDY (*)    Specific Gravity, Urine 1.035 (*)    All other components within normal limits  I-STAT BETA HCG BLOOD, ED ([facility], [facility], [facility] ONLY) - Abnormal; Notable for the following:    I-stat hCG, quantitative 1402.9 (*)    All other components within normal limits  I-STAT BETA HCG BLOOD, ED ([facility], [facility], [facility] ONLY) - Abnormal; Notable for the following:    I-stat hCG, quantitative 1310.6 (*)    All other components within normal limits  CBC WITH DIFFERENTIAL/PLATELET  APTT  PROTIME-INR  HIV ANTIBODY (ROUTINE TESTING)  RPR  TYPE AND SCREEN  ABO/RH  GC/CHLAMYDIA PROBE AMP ([place]) NOT AT [facility]    Imaging Review US Ob Comp Less 14 Wks  02/09/2016  CLINICAL DATA:  Abdominal discomfort, spotting affecting pregnancy in first trimester cramping ; beta HCG = 1311 EXAM: OBSTETRIC <14 WK [place] AND TRANSVAGINAL OB US TECHNIQUE: Both transabdominal and transvaginal ultrasound examinations were performed for complete evaluation of the gestation as well as the maternal uterus, adnexal regions, and pelvic cul-de-sac. Transvaginal technique was performed to assess early pregnancy. COMPARISON:  None. FINDINGS: Intrauterine  gestational sac: Questionable tiny gestational sac versus minimal endometrial fluid Yolk sac:  N/A Embryo:  N/A Cardiac Activity: N/A Heart Rate: N/A  bpm MSD: 1.9 mm greatest size  mm   CRL:    mm Subchorionic hemorrhage:  None Maternal uterus/adnexae: RIGHT ovary normal size and morphology 2.3 x 1.6 x 1.3 cm. LEFT ovary measures 2.8 x 2.7 x 2.8 cm and contains a small hemorrhagic corpus luteal cyst. Small amount of nonspecific free pelvic fluid. No additional adnexal masses. IMPRESSION: Questionable tiny gestational sac versus minimal endometrial fluid within uterus. In the absence of a definitive intrauterine pregnancy, ectopic pregnancy cannot be excluded. Recommend followup ultrasound and/or potentially serial quantitative beta HCG to definitively exclude ectopic pregnancy. Electronically Signed   By: Ulyses Southward M.D.   On: 02/09/2016 18:41   US Ob Transvaginal  02/09/2016  CLINICAL DATA:  Abdominal discomfort, spotting affecting pregnancy in first trimester cramping ; beta HCG = 1311 EXAM: OBSTETRIC <14 WK [place] AND TRANSVAGINAL OB US TECHNIQUE: Both transabdominal and transvaginal ultrasound examinations were performed for complete evaluation of the gestation as well as the maternal uterus, adnexal regions, and pelvic cul-de-sac. Transvaginal technique was performed to assess early pregnancy. COMPARISON:  None. FINDINGS: Intrauterine gestational sac:  Questionable tiny gestational sac versus minimal endometrial fluid Yolk sac:  N/A Embryo:  N/A Cardiac Activity: N/A Heart Rate: N/A  bpm MSD: 1.9 mm greatest size  mm   CRL:    mm Subchorionic hemorrhage:  None Maternal uterus/adnexae: RIGHT ovary normal size and morphology 2.3 x 1.6 x 1.3 cm. LEFT ovary measures 2.8 x 2.7 x 2.8 cm and contains a small hemorrhagic corpus luteal cyst. Small amount of nonspecific free pelvic fluid. No additional adnexal masses. IMPRESSION: Questionable tiny gestational sac versus minimal endometrial fluid within uterus. In the  absence of a definitive intrauterine pregnancy, ectopic pregnancy cannot be excluded. Recommend followup ultrasound and/or potentially serial quantitative beta HCG to definitively exclude ectopic pregnancy. Electronically Signed   By: Ulyses SouthwardMark  Boles M.D.   On: 02/09/2016 18:41   I have personally reviewed and evaluated these images and lab results as part of my medical decision-making.   EKG Interpretation None      MDM   Final diagnoses:  Pregnancy  Abdominal cramping  Vaginal spotting   Patient presents with light vaginal bleeding and abdominal cramping after positive pregnancy test.  VSS, NAD.  Abdomen with mild tenderness in suprapubic region.  Cervical os closed.  No blood in vaginal vault.  Labs without acute abnormalities.  Hcg 1402.9.  Will proceed with OB ultrasound to assess for IUP.  Possible gestational sac vs endometrial fluid.  Will have patient follow up with [facility] in 2 days for serial hcg monitoring.  Discussed return precautions.  Patient agrees and acknowledges the above plan for discharge.   Case has been discussed with Dr. Juleen ChinaKohut who agrees with the above plan for discharge.        Kim FowlerKayla Cheyna Retana, PA-C 02/09/16 Herbie [id number]  Raeford RazorStephen Kohut, MD 02/10/16 2003

## 2016-02-09 NOTE — ED Notes (Signed)
Pt ambulatory and independent at discharge.  

## 2016-02-09 NOTE — Discharge Instructions (Signed)
Your labs today are stable.  Your pelvic exam was normal and your cervix is closed.  Your are very early on in your pregnancy, and we were not able to see the gestational sac on ultrasound.  Therefore, it is VERY IMPORTANT that you follow up with [facility] in 2 days for repeat hcg levels to rule out ectopic pregnancy.  Return to the ED if you experience sudden severe abdominal pain, vaginal bleeding, or feel like you are about to pass out.   First Trimester of Pregnancy  The first trimester of pregnancy is from week 1 until the end of week 12 (months 1 through 3). A week after a sperm fertilizes an egg, the egg will implant on the wall of the uterus. This embryo will begin to develop into a baby. Genes from you and your partner are forming the baby. The female genes determine whether the baby is a boy or a girl. At 6-8 weeks, the eyes and face are formed, and the heartbeat can be seen on ultrasound. At the end of 12 weeks, all the baby's organs are formed.   Now that you are pregnant, you will want to do everything you can to have a healthy baby. Two of the most important things are to get good prenatal care and to follow your health care provider's instructions. Prenatal care is all the medical care you receive before the baby's birth. This care will help prevent, find, and treat any problems during the pregnancy and childbirth.  BODY CHANGES  Your body goes through many changes during pregnancy. The changes vary from woman to woman.  You may gain or lose a couple of pounds at first.  You may feel sick to your stomach (nauseous) and throw up (vomit). If the vomiting is uncontrollable, call your health care provider.  You may tire easily.  You may develop headaches that can be relieved by medicines approved by your health care provider.  You may urinate more often. Painful urination may mean you have a bladder infection.  You may develop heartburn as a result of your pregnancy.  You may develop  constipation because certain hormones are causing the muscles that push waste through your intestines to slow down.  You may develop hemorrhoids or swollen, bulging veins (varicose veins).  Your breasts may begin to grow larger and become tender. Your nipples may stick out more, and the tissue that surrounds them (areola) may become darker.  Your gums may bleed and may be sensitive to brushing and flossing.  Dark spots or blotches (chloasma, mask of pregnancy) may develop on your face. This will likely fade after the baby is born.  Your menstrual periods will stop.  You may have a loss of appetite.  You may develop cravings for certain kinds of food.  You may have changes in your emotions from day to day, such as being excited to be pregnant or being concerned that something may go wrong with the pregnancy and baby.  You may have more vivid and strange dreams.  You may have changes in your hair. These can include thickening of your hair, rapid growth, and changes in texture. Some women also have hair loss during or after pregnancy, or hair that feels dry or thin. Your hair will most likely return to normal after your baby is born. WHAT TO EXPECT AT YOUR PRENATAL VISITS  During a routine prenatal visit:  You will be weighed to make sure you and the baby are growing normally.  Your blood pressure will be taken.  Your abdomen will be measured to track your baby's growth.  The fetal heartbeat will be listened to starting around week 10 or 12 of your pregnancy.  Test results from any previous visits will be discussed. Your health care provider may ask you:  How you are feeling.  If you are feeling the baby move.  If you have had any abnormal symptoms, such as leaking fluid, bleeding, severe headaches, or abdominal cramping.  If you are using any tobacco products, including cigarettes, chewing tobacco, and electronic cigarettes.  If you have any questions. Other tests that may be performed during  your first trimester include:  Blood tests to find your blood type and to check for the presence of any previous infections. They will also be used to check for low iron levels (anemia) and Rh antibodies. Later in the pregnancy, blood tests for diabetes will be done along with other tests if problems develop.  Urine tests to check for infections, diabetes, or protein in the urine.  An ultrasound to confirm the proper growth and development of the baby.  An amniocentesis to check for possible genetic problems.  Fetal screens for spina bifida and Down syndrome.  You may need other tests to make sure you and the baby are doing well.  HIV (human immunodeficiency virus) testing. Routine prenatal testing includes screening for HIV, unless you choose not to have this test. HOME CARE INSTRUCTIONS  Medicines  Follow your health care provider's instructions regarding medicine use. Specific medicines may be either safe or unsafe to take during pregnancy.  Take your prenatal vitamins as directed.  If you develop constipation, try taking a stool softener if your health care provider approves. Diet  Eat regular, well-balanced meals. Choose a variety of foods, such as meat or vegetable-based protein, fish, milk and low-fat dairy products, vegetables, fruits, and whole grain breads and cereals. Your health care provider will help you determine the amount of weight gain that is right for you.  Avoid raw meat and uncooked cheese. These carry germs that can cause birth defects in the baby.  Eating four or five small meals rather than three large meals a day may help relieve nausea and vomiting. If you start to feel nauseous, eating a few soda crackers can be helpful. Drinking liquids between meals instead of during meals also seems to help nausea and vomiting.  If you develop constipation, eat more high-fiber foods, such as fresh vegetables or fruit and whole grains. Drink enough fluids to keep your urine clear or  pale yellow. Activity and Exercise  Exercise only as directed by your health care provider. Exercising will help you:  Control your weight.  Stay in shape.  Be prepared for labor and delivery. Experiencing pain or cramping in the lower abdomen or low back is a good sign that you should stop exercising. Check with your health care provider before continuing normal exercises.  Try to avoid standing for long periods of time. Move your legs often if you must stand in one place for a long time.  Avoid heavy lifting.  Wear low-heeled shoes, and practice good posture.  You may continue to have sex unless your health care provider directs you otherwise. Relief of Pain or Discomfort  Wear a good support bra for breast tenderness.  Take warm sitz baths to soothe any pain or discomfort caused by hemorrhoids. Use hemorrhoid cream if your health care provider approves.  Rest with your legs elevated  if you have leg cramps or low back pain.  If you develop varicose veins in your legs, wear support hose. Elevate your feet for 15 minutes, 3-4 times a day. Limit salt in your diet. Prenatal Care  Schedule your prenatal visits by the twelfth week of pregnancy. They are usually scheduled monthly at first, then more often in the last 2 months before delivery.  Write down your questions. Take them to your prenatal visits.  Keep all your prenatal visits as directed by your health care provider. Safety  Wear your seat belt at all times when driving.  Make a list of emergency phone numbers, including numbers for family, friends, the hospital, and police and fire departments. General Tips  Ask your health care provider for a referral to a local prenatal education class. Begin classes no later than at the beginning of month 6 of your pregnancy.  Ask for help if you have counseling or nutritional needs during pregnancy. Your health care provider can offer advice or refer you to specialists for help with various needs.    Do not use hot tubs, steam rooms, or saunas.  Do not douche or use tampons or scented sanitary pads.  Do not cross your legs for long periods of time.  Avoid cat litter boxes and soil used by cats. These carry germs that can cause birth defects in the baby and possibly loss of the fetus by miscarriage or stillbirth.  Avoid all smoking, herbs, alcohol, and medicines not prescribed by your health care provider. Chemicals in these affect the formation and growth of the baby.  Do not use any tobacco products, including cigarettes, chewing tobacco, and electronic cigarettes. If you need help quitting, ask your health care provider. You may receive counseling support and other resources to help you quit.  Schedule a dentist appointment. At home, brush your teeth with a soft toothbrush and be gentle when you floss. SEEK MEDICAL CARE IF:  You have dizziness.  You have mild pelvic cramps, pelvic pressure, or nagging pain in the abdominal area.  You have persistent nausea, vomiting, or diarrhea.  You have a bad smelling vaginal discharge.  You have pain with urination.  You notice increased swelling in your face, hands, legs, or ankles. SEEK IMMEDIATE MEDICAL CARE IF:  You have a fever.  You are leaking fluid from your vagina.  You have spotting or bleeding from your vagina.  You have severe abdominal cramping or pain.  You have rapid weight gain or loss.  You vomit blood or material that looks like coffee grounds.  You are exposed to [place] measles and have never had them.  You are exposed to fifth disease or chickenpox.  You develop a severe headache.  You have shortness of breath.  You have any kind of trauma, such as from a fall or a car accident. This information is not intended to replace advice given to you by your health care provider. Make sure you discuss any questions you have with your health care provider.  Document Released: 09/07/2001 Document Revised: 10/04/2014 Document Reviewed:  07/24/2013  Elsevier Interactive Patient Education [employer]

## 2016-02-09 NOTE — Progress Notes (Signed)
Patient listed as not having a pcp or insurance.  EDCM spoke to patient at bedside.  Patient reports she has [employer] insurance and [facility]  System updated.

## 2016-02-09 NOTE — ED Notes (Addendum)
Pt stated to Nurse First that she must go and pick up her daughter. Pt unable to wait any longer.

## 2016-02-09 NOTE — ED Notes (Signed)
Ultrasound at bedside

## 2016-02-09 NOTE — ED Notes (Signed)
Pt c/o vaginal bleeding and abdominal cramps since this morning. Pt reports having a positive pregnancy test on Saturday. Denies N/V/D.

## 2016-02-10 LAB — GC/CHLAMYDIA PROBE AMP (~~LOC~~) NOT AT ARMC
Chlamydia: NEGATIVE
NEISSERIA GONORRHEA: NEGATIVE

## 2016-02-10 LAB — RPR: RPR: NONREACTIVE

## 2016-02-10 LAB — HIV ANTIBODY (ROUTINE TESTING W REFLEX): HIV SCREEN 4TH GENERATION: NONREACTIVE

## 2016-02-18 ENCOUNTER — Other Ambulatory Visit: Payer: Medicaid Other

## 2016-02-21 ENCOUNTER — Inpatient Hospital Stay (HOSPITAL_COMMUNITY)
Admission: AD | Admit: 2016-02-21 | Discharge: 2016-02-21 | Disposition: A | Discharge status: Home / Self Care | Payer: Medicaid Other | Source: Ambulatory Visit | Attending: Family Medicine | Admitting: Family Medicine

## 2016-02-21 ENCOUNTER — Encounter (HOSPITAL_COMMUNITY): Payer: Self-pay | Admitting: *Deleted

## 2016-02-21 DIAGNOSIS — O3680X0 Pregnancy with inconclusive fetal viability, not applicable or unspecified: Secondary | ICD-10-CM

## 2016-02-21 DIAGNOSIS — Z3A01 Less than 8 weeks gestation of pregnancy: Secondary | ICD-10-CM | POA: Insufficient documentation

## 2016-02-21 DIAGNOSIS — O210 Mild hyperemesis gravidarum: Secondary | ICD-10-CM | POA: Insufficient documentation

## 2016-02-21 DIAGNOSIS — O219 Vomiting of pregnancy, unspecified: Secondary | ICD-10-CM

## 2016-02-21 LAB — URINALYSIS, ROUTINE W REFLEX MICROSCOPIC
BILIRUBIN URINE: NEGATIVE
GLUCOSE, UA: NEGATIVE mg/dL
Hgb urine dipstick: NEGATIVE
KETONES UR: NEGATIVE mg/dL
Leukocytes, UA: NEGATIVE
Nitrite: NEGATIVE
PH: 7 (ref 5.0–8.0)
Protein, ur: NEGATIVE mg/dL
Specific Gravity, Urine: 1.01 (ref 1.005–1.030)

## 2016-02-21 LAB — HCG, QUANTITATIVE, PREGNANCY: hCG, Beta Chain, Quant, S: 58323 m[IU]/mL — ABNORMAL HIGH (ref <5)

## 2016-02-21 MED ORDER — METOCLOPRAMIDE HCL 10 MG PO TABS: 10.0000 mg | ORAL_TABLET | Freq: Four times a day (QID) | ORAL | Status: DC | PRN

## 2016-02-21 MED ORDER — PROMETHAZINE HCL 12.5 MG PO TABS: 12.5000 mg | ORAL_TABLET | Freq: Once | ORAL | Status: DC

## 2016-02-21 MED ORDER — METOCLOPRAMIDE HCL 10 MG PO TABS
10.0000 mg | ORAL_TABLET | Freq: Once | ORAL | Status: AC
  Administered 2016-02-21: 10 mg via ORAL

## 2016-02-21 MED ORDER — PROMETHAZINE HCL 25 MG PO TABS: 12.5000 mg | ORAL_TABLET | Freq: Once | ORAL | Status: DC

## 2016-02-21 NOTE — MAU Note (Signed)
Pt states she can't keep anything down and it has been going on for 4-5 days.  She states she can't work and is stuck at home lying in the bed.

## 2016-02-21 NOTE — MAU Provider Note (Signed)
First Provider Initiated Contact with Patient 02/21/16 1810      Chief Complaint:  Emesis   Kim [name] is  25 y.o. [phone] at 7447w6d presents complaining of Emesis She has not felt good for 3-4 days, does not feel dehydrated.  Had ED visit 12 days ago, was supposed to FU for repeat hcg, did not come. Has to switch FP medicaid to pregnancy before she can get an OB appt.  Denies any bleeding or pain.   Obstetrical/Gynecological History: OB History    Gravida Para Term Preterm AB TAB SAB Ectopic Multiple Living   4 1 1  2 2    1      Past Medical History: Past Medical History  Diagnosis Date  . Asthma     Past Surgical History: Past Surgical History  Procedure Laterality Date  . Cesarean section      Family History: History reviewed. No pertinent family history.  Social History: Social History  Substance Use Topics  . Smoking status: Never Smoker   . Smokeless tobacco: None  . Alcohol Use: Yes    Allergies: No Known Allergies  Meds:  Prescriptions prior to admission  Medication Sig Dispense Refill Last Dose  . albuterol (PROVENTIL HFA;VENTOLIN HFA) 108 (90 BASE) MCG/ACT inhaler Inhale 2 puffs into the lungs every 6 (six) hours as needed for wheezing or shortness of breath. 1 Inhaler 2 Past Month at Unknown time    Review of Systems   Constitutional: Negative for fever and chills Eyes: Negative for visual disturbances Respiratory: Negative for shortness of breath, dyspnea Cardiovascular: Negative for chest pain or palpitations  Gastrointestinal: Negative for vomiting, diarrhea and constipation Genitourinary: Negative for dysuria and urgency Musculoskeletal: Negative for back pain, joint pain, myalgias.  Normal ROM  Neurological: Negative for dizziness and headaches    Physical Exam  Blood pressure 122/69, pulse 78, temperature 98.5 F (36.9 C), temperature source Oral, resp. rate 16, height 5\' 2"  (1.575 m), weight 70.308 kg (155 lb), last menstrual period  01/11/2016. GENERAL: Well-developed, well-nourished female in no acute distress.  LUNGS: Clear to auscultation bilaterally.  HEART: Regular rate and rhythm. ABDOMEN: Soft, nontender, nondistended, gravid.  EXTREMITIES: Nontender, no edema, 2+ distal pulses. DTR's 2+   Labs: Results for orders placed or performed during the hospital encounter of 02/21/16 (from the past 24 hour(s))  Urinalysis, Routine w reflex microscopic (not [facility])   Collection Time: 02/21/16  4:35 PM  Result Value Ref Range   Color, Urine YELLOW YELLOW   APPearance HAZY (A) CLEAR   Specific Gravity, Urine 1.010 1.005 - 1.030   pH 7.0 5.0 - 8.0   Glucose, UA NEGATIVE NEGATIVE mg/dL   Hgb urine dipstick NEGATIVE NEGATIVE   Bilirubin Urine NEGATIVE NEGATIVE   Ketones, ur NEGATIVE NEGATIVE mg/dL   Protein, ur NEGATIVE NEGATIVE mg/dL   Nitrite NEGATIVE NEGATIVE   Leukocytes, UA NEGATIVE NEGATIVE   Imaging Studies:  [place] Ob Comp Less 14 Wks  02/09/2016  CLINICAL DATA:  Abdominal discomfort, spotting affecting pregnancy in first trimester cramping ; beta HCG = 1311 EXAM: OBSTETRIC <14 WK US AND TRANSVAGINAL OB US TECHNIQUE: Both transabdominal and transvaginal ultrasound examinations were performed for complete evaluation of the gestation as well as the maternal uterus, adnexal regions, and pelvic cul-de-sac. Transvaginal technique was performed to assess early pregnancy. COMPARISON:  None. FINDINGS: Intrauterine gestational sac: Questionable tiny gestational sac versus minimal endometrial fluid Yolk sac:  N/A Embryo:  N/A Cardiac Activity: N/A Heart Rate: N/A  bpm MSD:  1.9 mm greatest size  mm   CRL:    mm Subchorionic hemorrhage:  None Maternal uterus/adnexae: RIGHT ovary normal size and morphology 2.3 x 1.6 x 1.3 cm. LEFT ovary measures 2.8 x 2.7 x 2.8 cm and contains a small hemorrhagic corpus luteal cyst. Small amount of nonspecific free pelvic fluid. No additional adnexal masses. IMPRESSION: Questionable tiny gestational  sac versus minimal endometrial fluid within uterus. In the absence of a definitive intrauterine pregnancy, ectopic pregnancy cannot be excluded. Recommend followup ultrasound and/or potentially serial quantitative beta HCG to definitively exclude ectopic pregnancy. Electronically Signed   By: Ulyses Southward M.D.   On: 02/09/2016 18:41   US Ob Transvaginal  02/09/2016  CLINICAL DATA:  Abdominal discomfort, spotting affecting pregnancy in first trimester cramping ; beta HCG = 1311 EXAM: OBSTETRIC <14 WK [place] AND TRANSVAGINAL OB US TECHNIQUE: Both transabdominal and transvaginal ultrasound examinations were performed for complete evaluation of the gestation as well as the maternal uterus, adnexal regions, and pelvic cul-de-sac. Transvaginal technique was performed to assess early pregnancy. COMPARISON:  None. FINDINGS: Intrauterine gestational sac: Questionable tiny gestational sac versus minimal endometrial fluid Yolk sac:  N/A Embryo:  N/A Cardiac Activity: N/A Heart Rate: N/A  bpm MSD: 1.9 mm greatest size  mm   CRL:    mm Subchorionic hemorrhage:  None Maternal uterus/adnexae: RIGHT ovary normal size and morphology 2.3 x 1.6 x 1.3 cm. LEFT ovary measures 2.8 x 2.7 x 2.8 cm and contains a small hemorrhagic corpus luteal cyst. Small amount of nonspecific free pelvic fluid. No additional adnexal masses. IMPRESSION: Questionable tiny gestational sac versus minimal endometrial fluid within uterus. In the absence of a definitive intrauterine pregnancy, ectopic pregnancy cannot be excluded. Recommend followup ultrasound and/or potentially serial quantitative beta HCG to definitively exclude ectopic pregnancy. Electronically Signed   By: Ulyses Southward M.D.   On: 02/09/2016 18:41    Assessment: Kim [name] is  25 y.o. [id number] at [redacted]w[redacted]d presents with nausea and vomiting, improved.  No vomiting at all in MAU  Plan: FU for outpt [place] Rx antiemetics  CRESENZO-DISHMAN,Wade Sigala 5/27/20178:07 PM

## 2016-02-21 NOTE — MAU Note (Signed)
Pt [facility] and [facility] and [facility]/S

## 2016-02-21 NOTE — Discharge Instructions (Signed)
Hyperemesis Gravidarum  Hyperemesis gravidarum is a severe form of nausea and vomiting that happens during pregnancy. Hyperemesis is worse than morning sickness. It may cause you to have nausea or vomiting all day for many days. It may keep you from eating and drinking enough food and liquids. Hyperemesis usually occurs during the first half (the first 20 weeks) of pregnancy. It often goes away once a woman is in her second half of pregnancy. However, sometimes hyperemesis continues through an entire pregnancy.   CAUSES   The cause of this condition is not completely known but is thought to be related to changes in the body's hormones when pregnant. It could be from the high level of the pregnancy hormone or an increase in estrogen in the body.   SIGNS AND SYMPTOMS    Severe nausea and vomiting.   Nausea that does not go away.   Vomiting that does not allow you to keep any food down.   Weight loss and body fluid loss (dehydration).   Having no desire to eat or not liking food you have previously enjoyed.  DIAGNOSIS   Your health care provider will do a physical exam and ask you about your symptoms. He or she may also order blood tests and urine tests to make sure something else is not causing the problem.   TREATMENT   You may only need medicine to control the problem. If medicines do not control the nausea and vomiting, you will be treated in the hospital to prevent dehydration, increased acid in the blood (acidosis), weight loss, and changes in the electrolytes in your body that may harm the unborn baby (fetus). You may need IV fluids.   HOME CARE INSTRUCTIONS    Only take over-the-counter or prescription medicines as directed by your health care provider.   Try eating a couple of dry crackers or toast in the morning before getting out of bed.   Avoid foods and smells that upset your stomach.   Avoid fatty and spicy foods.   Eat 5-6 small meals a day.   Do not drink when eating meals. Drink between  meals.   For snacks, eat high-protein foods, such as cheese.   Eat or suck on things that have ginger in them. Ginger helps nausea.   Avoid food preparation. The smell of food can spoil your appetite.   Avoid iron pills and iron in your multivitamins until after 3-4 months of being pregnant. However, consult with your health care provider before stopping any prescribed iron pills.  SEEK MEDICAL CARE IF:    Your abdominal pain increases.   You have a severe headache.   You have vision problems.   You are losing weight.  SEEK IMMEDIATE MEDICAL CARE IF:    You are unable to keep fluids down.   You vomit blood.   You have constant nausea and vomiting.   You have excessive weakness.   You have extreme thirst.   You have dizziness or fainting.   You have a fever or persistent symptoms for more than 2-3 days.   You have a fever and your symptoms suddenly get worse.  MAKE SURE YOU:    Understand these instructions.   Will watch your condition.   Will get help right away if you are not doing well or get worse.     This information is not intended to replace advice given to you by your health care provider. Make sure you discuss any questions you have with   your health care provider.     Document Released: 09/13/2005 Document Revised: 07/04/2013 Document Reviewed: 04/25/2013  Elsevier Interactive Patient Education 2016 Elsevier Inc.

## 2016-02-26 ENCOUNTER — Ambulatory Visit (HOSPITAL_COMMUNITY)
Admission: RE | Admit: 2016-02-26 | Discharge: 2016-02-26 | Disposition: A | Discharge status: Home / Self Care | Payer: Medicaid Other | Source: Ambulatory Visit | Attending: Advanced Practice Midwife | Admitting: Advanced Practice Midwife

## 2016-02-26 ENCOUNTER — Other Ambulatory Visit (HOSPITAL_COMMUNITY): Payer: Self-pay | Admitting: Advanced Practice Midwife

## 2016-02-26 DIAGNOSIS — Z3A01 Less than 8 weeks gestation of pregnancy: Secondary | ICD-10-CM | POA: Diagnosis not present

## 2016-02-26 DIAGNOSIS — O3680X0 Pregnancy with inconclusive fetal viability, not applicable or unspecified: Secondary | ICD-10-CM | POA: Insufficient documentation

## 2016-02-26 DIAGNOSIS — Z331 Pregnant state, incidental: Secondary | ICD-10-CM | POA: Diagnosis present

## 2016-02-27 ENCOUNTER — Telehealth: Payer: Self-pay | Admitting: *Deleted

## 2016-02-27 NOTE — Telephone Encounter (Signed)
Patient returned my call. I informed her of viable pregnancy per u/s with edd 10/14/16. Advised to arrange starting prenatal care and start prenatal vitamins. Patient voiced understanding. She said she is having difficulty getting medicaid started. I told her she can come to the clinic to obtain a pregnancy verification letter which should help with this. Understanding voiced.

## 2016-02-27 NOTE — Telephone Encounter (Signed)
Received message from u/s, patient had u/s for fetal viability 6/1, but was not seen in clinic for f/u. Needs results called to her. Discussed results with Dr Debroah LoopArnold who was ok with calling results and encouraging her to start prenatal care. Attempted to call patient. There was no answer, voice mail was left stating I am calling to give her u/s results. Please return my call at the clinics.

## 2016-03-03 ENCOUNTER — Encounter (HOSPITAL_COMMUNITY): Payer: Self-pay | Admitting: *Deleted

## 2016-03-03 ENCOUNTER — Inpatient Hospital Stay (HOSPITAL_COMMUNITY)
Admission: AD | Admit: 2016-03-03 | Discharge: 2016-03-03 | Disposition: A | Discharge status: Home / Self Care | Payer: Medicaid Other | Source: Ambulatory Visit | Attending: Obstetrics and Gynecology | Admitting: Obstetrics and Gynecology

## 2016-03-03 ENCOUNTER — Inpatient Hospital Stay (HOSPITAL_COMMUNITY): Payer: Medicaid Other

## 2016-03-03 DIAGNOSIS — Z3A08 8 weeks gestation of pregnancy: Secondary | ICD-10-CM | POA: Diagnosis not present

## 2016-03-03 DIAGNOSIS — O4691 Antepartum hemorrhage, unspecified, first trimester: Secondary | ICD-10-CM

## 2016-03-03 DIAGNOSIS — O209 Hemorrhage in early pregnancy, unspecified: Secondary | ICD-10-CM

## 2016-03-03 DIAGNOSIS — R109 Unspecified abdominal pain: Secondary | ICD-10-CM

## 2016-03-03 DIAGNOSIS — O43891 Other placental disorders, first trimester: Secondary | ICD-10-CM

## 2016-03-03 DIAGNOSIS — J45909 Unspecified asthma, uncomplicated: Secondary | ICD-10-CM | POA: Diagnosis not present

## 2016-03-03 DIAGNOSIS — O99511 Diseases of the respiratory system complicating pregnancy, first trimester: Secondary | ICD-10-CM | POA: Insufficient documentation

## 2016-03-03 DIAGNOSIS — O26899 Other specified pregnancy related conditions, unspecified trimester: Secondary | ICD-10-CM

## 2016-03-03 DIAGNOSIS — Z3A01 Less than 8 weeks gestation of pregnancy: Secondary | ICD-10-CM

## 2016-03-03 LAB — URINALYSIS, ROUTINE W REFLEX MICROSCOPIC
Bilirubin Urine: NEGATIVE
Glucose, UA: NEGATIVE mg/dL
Hgb urine dipstick: NEGATIVE
Ketones, ur: NEGATIVE mg/dL
LEUKOCYTES UA: NEGATIVE
NITRITE: NEGATIVE
PH: 6 (ref 5.0–8.0)
Protein, ur: NEGATIVE mg/dL
SPECIFIC GRAVITY, URINE: 1.02 (ref 1.005–1.030)

## 2016-03-03 MED ORDER — PROMETHAZINE HCL 25 MG PO TABS: 25.0000 mg | ORAL_TABLET | Freq: Four times a day (QID) | ORAL | Status: DC | PRN

## 2016-03-03 MED ORDER — METOCLOPRAMIDE HCL 10 MG PO TABS: 10.0000 mg | ORAL_TABLET | Freq: Four times a day (QID) | ORAL | Status: DC

## 2016-03-03 NOTE — Discharge Instructions (Signed)
Morning Sickness Morning sickness is when you feel sick to your stomach (nauseous) during pregnancy. This nauseous feeling may or may not come with vomiting. It often occurs in the morning but can be a problem any time of day. Morning sickness is most common during the first trimester, but it may continue throughout pregnancy. While morning sickness is unpleasant, it is usually harmless unless you develop severe and continual vomiting (hyperemesis gravidarum). This condition requires more intense treatment.  CAUSES  The cause of morning sickness is not completely known but seems to be related to normal hormonal changes that occur in pregnancy. RISK FACTORS You are at greater risk if you:  Experienced nausea or vomiting before your pregnancy.  Had morning sickness during a previous pregnancy.  Are pregnant with more than one baby, such as twins. TREATMENT  Do not use any medicines (prescription, over-the-counter, or herbal) for morning sickness without first talking to your health care provider. Your health care provider may prescribe or recommend:  Vitamin B6 supplements.  Anti-nausea medicines.  The herbal medicine ginger. HOME CARE INSTRUCTIONS   Only take over-the-counter or prescription medicines as directed by your health care provider.  Taking multivitamins before getting pregnant can prevent or decrease the severity of morning sickness in most women.  Eat a piece of dry toast or unsalted crackers before getting out of bed in the morning.  Eat five or six small meals a day.  Eat dry and bland foods (rice, baked potato). Foods high in carbohydrates are often helpful.  Do not drink liquids with your meals. Drink liquids between meals.  Avoid greasy, fatty, and spicy foods.  Get someone to cook for you if the smell of any food causes nausea and vomiting.  If you feel nauseous after taking prenatal vitamins, take the vitamins at night or with a snack.  Snack on protein  foods (nuts, yogurt, cheese) between meals if you are hungry.  Eat unsweetened gelatins for desserts.  Wearing an acupressure wristband (worn for sea sickness) may be helpful.  Acupuncture may be helpful.  Do not smoke.  Get a humidifier to keep the air in your house free of odors.  Get plenty of fresh air. SEEK MEDICAL CARE IF:   Your home remedies are not working, and you need medicine.  You feel dizzy or lightheaded.  You are losing weight. SEEK IMMEDIATE MEDICAL CARE IF:   You have persistent and uncontrolled nausea and vomiting.  You pass out (faint). MAKE SURE YOU:  Understand these instructions.  Will watch your condition.  Will get help right away if you are not doing well or get worse.   This information is not intended to replace advice given to you by your health care provider. Make sure you discuss any questions you have with your health care provider.   Document Released: 11/04/2006 Document Revised: 09/18/2013 Document Reviewed: 02/28/2013 Elsevier Interactive Patient Education 2016 Elsevier Inc. Vaginal Bleeding During Pregnancy, First Trimester A small amount of bleeding (spotting) from the vagina is relatively common in early pregnancy. It usually stops on its own. Various things may cause bleeding or spotting in early pregnancy. Some bleeding may be related to the pregnancy, and some may not. In most cases, the bleeding is normal and is not a problem. However, bleeding can also be a sign of something serious. Be sure to tell your health care provider about any vaginal bleeding right away. Some possible causes of vaginal bleeding during the first trimester include:  Infection or inflammation  inflammation of the cervix. °· Growths (polyps) on the cervix. °· Miscarriage or threatened miscarriage. °· Pregnancy tissue has developed outside of the uterus and in a fallopian tube (tubal pregnancy). °· Tiny cysts have developed in the uterus instead of pregnancy tissue (molar  pregnancy). °HOME CARE INSTRUCTIONS  °Watch your condition for any changes. The following actions may help to lessen any discomfort you are feeling: °· Follow your health care provider's instructions for limiting your activity. If your health care provider orders bed rest, you may need to stay in bed and only get up to use the bathroom. However, your health care provider may allow you to continue light activity. °· If needed, make plans for someone to help with your regular activities and responsibilities while you are on bed rest. °· Keep track of the number of pads you use each day, how often you change pads, and how soaked (saturated) they are. Write this down. °· Do not use tampons. Do not douche. °· Do not have sexual intercourse or orgasms until approved by your health care provider. °· If you pass any tissue from your vagina, save the tissue so you can show it to your health care provider. °· Only take over-the-counter or prescription medicines as directed by your health care provider. °· Do not take aspirin because it can make you bleed. °· Keep all follow-up appointments as directed by your health care provider. °SEEK MEDICAL CARE IF: °· You have any vaginal bleeding during any part of your pregnancy. °· You have cramps or labor pains. °· You have a fever, not controlled by medicine. °SEEK IMMEDIATE MEDICAL CARE IF:  °· You have severe cramps in your back or belly (abdomen). °· You pass large clots or tissue from your vagina. °· Your bleeding increases. °· You feel light-headed or weak, or you have fainting episodes. °· You have chills. °· You are leaking fluid or have a gush of fluid from your vagina. °· You pass out while having a bowel movement. °MAKE SURE YOU: °· Understand these instructions. °· Will watch your condition. °· Will get help right away if you are not doing well or get worse. °  °This information is not intended to replace advice given to you by your health care provider. Make sure you  discuss any questions you have with your health care provider. °  °Document Released: 06/23/2005 Document Revised: 09/18/2013 Document Reviewed: 05/21/2013 °Elsevier Interactive Patient Education ©2016 Elsevier Inc. ° °

## 2016-03-03 NOTE — MAU Note (Signed)
Pt states she had a BM this AM and had bright red spotting when she wiped. Pt denies recent intercourse. Pt states she pain is 7/10 in her lower abdomen.

## 2016-03-03 NOTE — MAU Provider Note (Signed)
History     CSN: [id number]  Arrival date and time: 03/03/16 1203   First Provider Initiated Contact with Patient 03/03/16 1236      Chief Complaint  Patient presents with  . Vaginal Bleeding   HPI  Kim [name] is a 25 y.o. [id number] at [redacted]w[redacted]d who presents with vaginal bleeding. Reports bright red spotting on toilet paper once this morning after having a bowel movement. Pt is sure bleeding came from her vagina, not her rectum. Reports some lower abdominal cramping since then. Describes pain as achy and rates 7/10. Some n/v continuing throughout pregnancy. Has vomited once today. States is almost out of her medications & would like refills. Has not started prenatal care yet as she is waiting for her pregnancy medicaid.  Denies recent intercourse.    OB History    Gravida Para Term Preterm AB TAB SAB Ectopic Multiple Living   Past Medical History  Diagnosis Date  . Asthma     Past Surgical History  Procedure Laterality Date  . Cesarean section      History reviewed. No pertinent family history.  Social History  Substance Use Topics  . Smoking status: Never Smoker   . Smokeless tobacco: None  . Alcohol Use: Yes     Comment: not while preg    Allergies: No Known Allergies  Prescriptions prior to admission  Medication Sig Dispense Refill Last Dose  . metoCLOPramide (REGLAN) 10 MG tablet Take 1 tablet (10 mg total) by mouth every 6 (six) hours as needed for nausea. 30 tablet 0 03/03/2016 at Unknown time  . promethazine (PHENERGAN) 12.5 MG tablet Take 1 tablet (12.5 mg total) by mouth once. 30 tablet 0 03/02/2016 at Unknown time  . albuterol (PROVENTIL HFA;VENTOLIN HFA) 108 (90 BASE) MCG/ACT inhaler Inhale 2 puffs into the lungs every 6 (six) hours as needed for wheezing or shortness of breath. 1 Inhaler 2 More than a month at Unknown time    Review of Systems  Constitutional: Negative.   Gastrointestinal: Positive for nausea, vomiting, abdominal pain and  constipation. Negative for diarrhea and blood in stool.  Genitourinary: Negative for dysuria.       + vaginal bleeding   Physical Exam   Blood pressure 124/70, pulse 79, temperature 98 F (36.7 C), temperature source Oral, resp. rate 20, height  (1.575 m), weight 157 lb 3.2 oz (71.305 kg), last menstrual period 01/11/2016.  Physical Exam  Nursing note and vitals reviewed. Constitutional: She is oriented to person, place, and time. She appears well-developed and well-nourished. No distress.  HENT:  Head: Normocephalic and atraumatic.  Eyes: Conjunctivae are normal. Right eye exhibits no discharge. Left eye exhibits no discharge. No scleral icterus.  Neck: Normal range of motion.  Cardiovascular: Normal rate, regular rhythm and normal heart sounds.   No murmur heard. Respiratory: Effort normal and breath sounds normal. No respiratory distress. She has no wheezes.  GI: Soft. Bowel sounds are normal. She exhibits no distension. There is no tenderness. There is no rebound and no guarding.  Genitourinary: Vagina normal. Cervix exhibits no motion tenderness. No bleeding in the vagina. No vaginal discharge found.  Cervix closed Minimal amount of pink blood at cervical os  Neurological: She is alert and oriented to person, place, and time.  Skin: Skin is warm and dry. She is not diaphoretic.  Psychiatric: She has a normal mood and affect. Her behavior is normal.  Judgment and thought content normal.    MAU Course  Procedures Results for orders placed or performed during the hospital encounter of 03/03/16 (from the past 24 hour(s))  Urinalysis, Routine w reflex microscopic (not at [facility])     Status: None   Collection Time: 03/03/16 12:14 PM  Result Value Ref Range   Color, Urine YELLOW YELLOW   APPearance CLEAR CLEAR   Specific Gravity, Urine 1.020 1.005 - 1.030   pH 6.0 5.0 - 8.0   Glucose, UA NEGATIVE NEGATIVE mg/dL   Hgb urine dipstick NEGATIVE NEGATIVE   Bilirubin Urine NEGATIVE  NEGATIVE   Ketones, ur NEGATIVE NEGATIVE mg/dL   Protein, ur NEGATIVE NEGATIVE mg/dL   Nitrite NEGATIVE NEGATIVE   Leukocytes, UA NEGATIVE NEGATIVE   [place] Ob Transvaginal  03/03/2016  CLINICAL DATA:  25 year old female pregnant patient with vaginal bleeding. EXAM: TRANSVAGINAL OB ULTRASOUND TECHNIQUE: Transvaginal ultrasound was performed for complete evaluation of the gestation as well as the maternal uterus, adnexal regions, and pelvic cul-de-sac. COMPARISON:  Ob ultrasound 02/26/2016. FINDINGS: Intrauterine gestational sac: Single and ovoid shaped. Yolk sac:  Present. Embryo:  Present. Cardiac Activity: Present. Heart Rate: 160 bpm CRL:   18  mm   8 w 2 d                  US EDC: 10/11/2016 Subchorionic hemorrhage:  None visualized. Maternal uterus/adnexae: Bilateral ovaries are normal in appearance. Trace volume of free fluid in the cul-de-sac. IMPRESSION: 1. Single viable IUP with estimated gestational age of [redacted] weeks and 2 days and normal fetal heart rate of 160 beats per minute. No acute findings. Electronically Signed   By: Trudie Reedaniel  Entrikin M.D.   On: 03/03/2016 13:45    MDM O positive Per review of previous u/s pt has small SCH. Discussed with pt this is the likely cause of her spotting, esp. After straining for a BM. Pt very concerned and wants to make sure baby is ok. Will get u/s for reassurance.  Ultrasound shows SIUP with cardiac activity. [facility] no [facility].  Assessment and Plan  A: 1. [redacted] weeks gestation of pregnancy   2. Subchorionic hematoma, first trimester   3. Vaginal bleeding in pregnancy, first trimester   4. Abdominal pain affecting pregnancy     P; Discharge home Refilled reglan & promethazine Pelvic rest Start prenatal care Discussed reasons to return to MAU  Judeth HornErin Eithen Castiglia 03/03/2016, 12:36 PM

## 2016-04-15 ENCOUNTER — Ambulatory Visit (INDEPENDENT_AMBULATORY_CARE_PROVIDER_SITE_OTHER): Payer: Medicaid Other | Admitting: Obstetrics and Gynecology

## 2016-04-15 ENCOUNTER — Encounter: Payer: Self-pay | Admitting: Obstetrics and Gynecology

## 2016-04-15 VITALS — BP 115/74 | HR 78 | Wt 162.0 lb

## 2016-04-15 DIAGNOSIS — Z3490 Encounter for supervision of normal pregnancy, unspecified, unspecified trimester: Secondary | ICD-10-CM | POA: Insufficient documentation

## 2016-04-15 DIAGNOSIS — Z3482 Encounter for supervision of other normal pregnancy, second trimester: Secondary | ICD-10-CM | POA: Diagnosis not present

## 2016-04-15 DIAGNOSIS — Z3492 Encounter for supervision of normal pregnancy, unspecified, second trimester: Secondary | ICD-10-CM

## 2016-04-15 DIAGNOSIS — Z98891 History of uterine scar from previous surgery: Secondary | ICD-10-CM

## 2016-04-15 NOTE — Progress Notes (Signed)
New OB Note  04/15/2016   Clinic: [facility]  Chief Complaint: New OB  Transfer of Care Patient: no  History of Present Illness: Ms. Billman is a 25 y.o. [id number] @ 13/4 weeks (EDC 1/21, based on LMP=7wk u/s,  Patient's last menstrual period was 01/11/2016.), with the above CC. Preg complicated by has Supervision of normal pregnancy in second trimester and History of cesarean delivery on her problem list.   She has Negative signs or symptoms of nausea/vomiting of pregnancy. She has Negative signs or symptoms of miscarriage or preterm labor She identifies Negative Zika risk factors for her and her partner  ROS: A 12-point review of systems was performed and negative, except as stated in the above HPI.  OBGYN History: As per HPI. OB History  Gravida Para Term Preterm AB SAB TAB Ectopic Multiple Living  # Outcome Date GA Lbr Len/2nd Weight Sex Delivery Anes PTL Lv  4 Current           3 Term 04/09/11   8 lb 5 oz (3.771 kg) F CS-Unspec EPI  Y     Complications: Failure to Progress in Second Stage  2 TAB           1 TAB               Any prior children are healthy, doing well, without any problems or issues: yes History of pap smears: Yes. Last pap smear unknown. Abnormal: no History of STIs: Yes   Past Medical History: Past Medical History  Diagnosis Date  . Asthma     Past Surgical History: Past Surgical History  Procedure Laterality Date  . Cesarean section      Family History:  Family History  Problem Relation Age of Onset  . Hypertension Mother   . Cancer Maternal Grandmother   . Stroke Neg Hx    She denies any female cancers, bleeding or blood clotting disorders.  She denies any history of mental retardation, birth defects or genetic disorders in her or the FOB's history  Social History:  Social History   Social History  . Marital Status: Single    Spouse Name: N/A  . Number of Children: N/A  . Years of Education: N/A   Occupational  History  . Not on file.   Social History Main Topics  . Smoking status: Never Smoker   . Smokeless tobacco: Not on file  . Alcohol Use: Yes     Comment: not while preg  . Drug Use: No  . Sexual Activity: Yes     Comment: last sex early May 2017   Other Topics Concern  . Not on file   Social History Narrative   Any pets in the household: no Works as a Lawyer   Allergy: No Known Allergies  Health Maintenance:  Mammogram Up to Date: not applicable  Current Outpatient Medications: PNV  Physical Exam:   BP 115/74 mmHg  Pulse 78  Wt 162 lb (73.483 kg)  LMP 01/11/2016 Body mass index is 29.62 kg/(m^2). Fundal height: not applicable FHTs: 150s  General appearance: Well nourished, well developed female in no acute distress.  Neck:  Supple, normal appearance, and no thyromegaly  Cardiovascular: S1, S2 normal, no murmur, rub or gallop, regular rate and rhythm Respiratory:  Clear to auscultation bilateral. Normal respiratory effort Abdomen: positive bowel sounds and no masses, hernias; diffusely non tender to palpation, non distended Breasts: breasts appear  normal, no suspicious masses, no skin or nipple changes or axillary nodes, and normal palpation. Neuro/Psych:  Normal mood and affect.  Skin:  Warm and dry.  Lymphatic:  No inguinal lymphadenopathy.   Pelvic exam: is not limited by body habitus EGBUS: within normal limits, Vagina: within normal limits and with no blood in the vault, Cervix: normal appearing cervix without discharge or lesions, closed/long/high, Uterus:  enlarged, c/w 14 week size, and Adnexa:  normal adnexa and no mass, fullness, tenderness Pelvis feels normal  Laboratory: Normal hiv, rpr, O pos, cbc and GC/CT in May/June ER visits  Imaging:  Normal u/s in the ER/MAU  Assessment: patient doing well  Plan: 1. Supervision of normal pregnancy in second trimester Routine care. Anatomy scan ordered for 18-20wks. Genetic screening interested in. Will  bring back in 2-3wks for quad screen - US OB Detail; Future - Rubella screen - Urine Culture - Hepatitis B Surface AntiGEN - Pap Lb, rfx HPV ASCU  2. History of cesarean delivery Interested in repeat. Pt to bring hospital info nv so we can request op note. Pelvis feels normal and adequate  Problem list reviewed and updated.  Follow up in 2 weeks.  >50% of 20 min visit spent on counseling and coordination of care.     Cornelia Copaharlie Petros Ahart, Jr. MD Attending Center for [facility](Faculty Practice)

## 2016-04-18 LAB — URINE CULTURE

## 2016-04-19 ENCOUNTER — Encounter: Payer: Self-pay | Admitting: Obstetrics and Gynecology

## 2016-04-19 DIAGNOSIS — R8271 Bacteriuria: Secondary | ICD-10-CM | POA: Insufficient documentation

## 2016-04-22 ENCOUNTER — Telehealth: Payer: Self-pay | Admitting: *Deleted

## 2016-04-22 LAB — PAP LB, RFX HPV ASCU: PAP SMEAR COMMENT: 0

## 2016-04-22 LAB — HPV DNA PROBE HIGH RISK, AMPLIFIED: HPV, HIGH-RISK: POSITIVE — AB

## 2016-04-23 ENCOUNTER — Encounter: Payer: Self-pay | Admitting: Obstetrics and Gynecology

## 2016-04-23 DIAGNOSIS — R87610 Atypical squamous cells of undetermined significance on cytologic smear of cervix (ASC-US): Secondary | ICD-10-CM | POA: Insufficient documentation

## 2016-04-23 DIAGNOSIS — R87810 Cervical high risk human papillomavirus (HPV) DNA test positive: Secondary | ICD-10-CM

## 2016-04-23 LAB — HEMOGLOBINOPATHY EVALUATION
HEMOGLOBIN F QUANTITATION: 0 % (ref 0.0–2.0)
HGB A: 97.5 % (ref 94.0–98.0)
HGB C: 0 %
HGB S: 0 %
Hemoglobin A2 Quantitation: 2.5 % (ref 0.7–3.1)

## 2016-04-23 LAB — CYSTIC FIBROSIS GENE TEST

## 2016-04-23 LAB — RUBELLA SCREEN: Rubella Antibodies, IGG: 5.62 index (ref >0.99)

## 2016-04-23 LAB — HEPATITIS B SURFACE ANTIGEN: HEP B S AG: NEGATIVE

## 2016-04-29 ENCOUNTER — Ambulatory Visit (INDEPENDENT_AMBULATORY_CARE_PROVIDER_SITE_OTHER): Payer: Medicaid Other | Admitting: Obstetrics and Gynecology

## 2016-04-29 VITALS — BP 131/75 | HR 80 | Temp 98.9°F | Wt 165.0 lb

## 2016-04-29 DIAGNOSIS — Z98891 History of uterine scar from previous surgery: Secondary | ICD-10-CM

## 2016-04-29 DIAGNOSIS — Z3492 Encounter for supervision of normal pregnancy, unspecified, second trimester: Secondary | ICD-10-CM

## 2016-04-29 DIAGNOSIS — IMO0002 Reserved for concepts with insufficient information to code with codable children: Secondary | ICD-10-CM

## 2016-04-29 DIAGNOSIS — Z1389 Encounter for screening for other disorder: Secondary | ICD-10-CM

## 2016-04-29 DIAGNOSIS — Z331 Pregnant state, incidental: Secondary | ICD-10-CM

## 2016-04-29 LAB — POCT URINALYSIS DIPSTICK
Bilirubin, UA: NEGATIVE
Blood, UA: NEGATIVE
GLUCOSE UA: NEGATIVE
KETONES UA: NEGATIVE
LEUKOCYTES UA: NEGATIVE
Nitrite, UA: POSITIVE
Urobilinogen, UA: 0.2
pH, UA: 8

## 2016-04-29 NOTE — Progress Notes (Signed)
Subjective:  Kim [name] is a 25 y.o. [id number] at [redacted]w[redacted]d being seen today for ongoing prenatal care.  She is currently monitored for the following issues for this low-risk pregnancy and has Supervision of normal pregnancy in second trimester; History of cesarean delivery; Asymptomatic bacteriuria; and ASCUS with positive high risk HPV on her problem list.  Patient reports no complaints.  Contractions: Not present. Vag. Bleeding: None.  Movement: Present. Denies leaking of fluid.   The following portions of the patient's history were reviewed and updated as appropriate: allergies, current medications, past family history, past medical history, past social history, past surgical history and problem list. Problem list updated.  Objective:   Vitals:   04/29/16 1039  BP: 131/75  Pulse: 80  Temp: 98.9 F (37.2 C)  Weight: 165 lb (74.8 kg)    Fetal Status:     Movement: Present     General:  Alert, oriented and cooperative. Patient is in no acute distress.  Skin: Skin is warm and dry. No rash noted.   Cardiovascular: Normal heart rate noted  Respiratory: Normal respiratory effort, no problems with respiration noted  Abdomen: Soft, gravid, appropriate for gestational age. Pain/Pressure: Present     Pelvic:  Cervical exam deferred        Extremities: Normal range of motion.  Edema: None  Mental Status: Normal mood and affect. Normal behavior. Normal judgment and thought content.   Urinalysis:      Assessment and Plan:  Pregnancy: [id number] at [redacted]w[redacted]d  1. Prenatal care, second trimester - POCT Urinalysis Dipstick  2. Supervision of normal pregnancy in second trimester Follow up anatomy ultrasound scheduled 05/20/2016  3. History of cesarean delivery Still awaiting records  4. ASCUS with positive high risk HPV Will have colpo pp  General obstetric precautions including but not limited to vaginal bleeding, contractions, leaking of fluid and fetal movement were reviewed in detail with the  patient. Please refer to After Visit Summary for other counseling recommendations.  Return in about 4 weeks (around 05/27/2016) for ROB.   Catalina Antigua, MD

## 2016-04-29 NOTE — Progress Notes (Signed)
Subjective:  Kim [name] is a 25 y.o. [id number] at [redacted]w[redacted]d being seen today for ongoing prenatal care.  She is currently monitored for the following issues for this low-risk pregnancy and has Supervision of normal pregnancy in second trimester; History of cesarean delivery; Asymptomatic bacteriuria; and ASCUS with positive high risk HPV on her problem list.  Patient reports no complaints.  Contractions: Not present. Vag. Bleeding: None.  Movement: Present. Denies leaking of fluid.   The following portions of the patient's history were reviewed and updated as appropriate: allergies, current medications, past family history, past medical history, past social history, past surgical history and problem list. Problem list updated.  Objective:   Vitals:   04/29/16 1039  BP: 131/75  Pulse: 80  Temp: 98.9 F (37.2 C)  Weight: 165 lb (74.8 kg)    Fetal Status: Fetal Heart Rate (bpm): 150   Movement: Present     General:  Alert, oriented and cooperative. Patient is in no acute distress.  Skin: Skin is warm and dry. No rash noted.   Cardiovascular: Normal heart rate noted  Respiratory: Normal respiratory effort, no problems with respiration noted  Abdomen: Soft, gravid, appropriate for gestational age. Pain/Pressure: Present     Pelvic:  Cervical exam deferred        Extremities: Normal range of motion.  Edema: None  Mental Status: Normal mood and affect. Normal behavior. Normal judgment and thought content.   Urinalysis:      Assessment and Plan:  Pregnancy: [id number] at [redacted]w[redacted]d  1. Prenatal care, second trimester - POCT Urinalysis Dipstick  2. Supervision of normal pregnancy in second trimester Patient is doing well Follow up anatomy ultrasound scheduled on 8/24 Quad screen at next visit Complete antibiotics for UTI  3. History of cesarean delivery Desires repeat Will obtain delivery records from [place]  4. ASCUS with positive high risk HPV colpo pp  General obstetric precautions  including but not limited to vaginal bleeding, contractions, leaking of fluid and fetal movement were reviewed in detail with the patient. Please refer to After Visit Summary for other counseling recommendations.  Return in about 4 weeks (around 05/27/2016) for ROB.   Catalina Antigua, MD

## 2016-04-29 NOTE — Addendum Note (Signed)
Addended by: Lear Ng on: 04/29/2016 11:53 AM   Modules accepted: Orders

## 2016-04-29 NOTE — Progress Notes (Signed)
Pt c/o lower abdominal pressure 

## 2016-05-01 LAB — CULTURE, URINE COMPREHENSIVE

## 2016-05-02 ENCOUNTER — Other Ambulatory Visit: Payer: Self-pay | Admitting: Obstetrics

## 2016-05-03 ENCOUNTER — Other Ambulatory Visit: Payer: Self-pay | Admitting: Obstetrics

## 2016-05-03 MED ORDER — AMOXICILLIN-POT CLAVULANATE 875-125 MG PO TABS: 1.0000 | ORAL_TABLET | Freq: Two times a day (BID) | ORAL | 0 refills | Status: DC

## 2016-05-04 NOTE — Telephone Encounter (Signed)
Patient called and made aware that she has a urinary tract infection. Patient states she has picked up the anitibiotics and has a few days left. Patient made aware that its important to finish full treatment of antibiotics to get rid of the UTI. patientstates understanding. Kim StammerJennifer Jerrold Haskell RNBSN

## 2016-05-04 NOTE — Telephone Encounter (Signed)
-----   Message from Brock Badharles A Harper, MD sent at 05/03/2016 12:07 PM EDT ----- Augmentin Rx

## 2016-05-20 ENCOUNTER — Other Ambulatory Visit: Payer: Self-pay | Admitting: Obstetrics and Gynecology

## 2016-05-20 ENCOUNTER — Ambulatory Visit (INDEPENDENT_AMBULATORY_CARE_PROVIDER_SITE_OTHER): Payer: Medicaid Other

## 2016-05-20 DIAGNOSIS — Z3492 Encounter for supervision of normal pregnancy, unspecified, second trimester: Secondary | ICD-10-CM

## 2016-05-20 DIAGNOSIS — Z36 Encounter for antenatal screening of mother: Secondary | ICD-10-CM | POA: Diagnosis not present

## 2016-05-26 ENCOUNTER — Ambulatory Visit (INDEPENDENT_AMBULATORY_CARE_PROVIDER_SITE_OTHER): Payer: Medicaid Other | Admitting: Obstetrics and Gynecology

## 2016-05-26 ENCOUNTER — Encounter: Payer: Medicaid Other | Admitting: Obstetrics and Gynecology

## 2016-05-26 VITALS — BP 112/71 | HR 76 | Temp 99.7°F | Wt 169.7 lb

## 2016-05-26 DIAGNOSIS — IMO0002 Reserved for concepts with insufficient information to code with codable children: Secondary | ICD-10-CM

## 2016-05-26 DIAGNOSIS — Z1389 Encounter for screening for other disorder: Secondary | ICD-10-CM | POA: Diagnosis not present

## 2016-05-26 DIAGNOSIS — Z3492 Encounter for supervision of normal pregnancy, unspecified, second trimester: Secondary | ICD-10-CM

## 2016-05-26 DIAGNOSIS — Z3482 Encounter for supervision of other normal pregnancy, second trimester: Secondary | ICD-10-CM | POA: Diagnosis not present

## 2016-05-26 DIAGNOSIS — Z98891 History of uterine scar from previous surgery: Secondary | ICD-10-CM

## 2016-05-26 DIAGNOSIS — Z331 Pregnant state, incidental: Secondary | ICD-10-CM | POA: Diagnosis not present

## 2016-05-26 LAB — POCT URINALYSIS DIPSTICK
Bilirubin, UA: NEGATIVE
GLUCOSE UA: NEGATIVE
Ketones, UA: NEGATIVE
LEUKOCYTES UA: NEGATIVE
NITRITE UA: NEGATIVE
Protein, UA: NEGATIVE
SPEC GRAV UA: 1.01
UROBILINOGEN UA: 0.2
pH, UA: 7

## 2016-05-26 NOTE — Progress Notes (Signed)
   PRENATAL VISIT NOTE  Subjective:  Kim [name] is a 25 y.o. [id number] at 9247w3d being seen today for ongoing prenatal care.  She is currently monitored for the following issues for this low-risk pregnancy and has Supervision of normal pregnancy in second trimester; History of cesarean delivery; Asymptomatic bacteriuria; and ASCUS with positive high risk HPV on her problem list.  Patient reports no complaints.  Contractions: Not present. Vag. Bleeding: None.  Movement: Present. Denies leaking of fluid.   The following portions of the patient's history were reviewed and updated as appropriate: allergies, current medications, past family history, past medical history, past social history, past surgical history and problem list. Problem list updated.  Objective:   Vitals:   05/26/16 1333  BP: 112/71  Pulse: 76  Temp: 99.7 F (37.6 C)  Weight: 169 lb 11.2 oz (77 kg)    Fetal Status: Fetal Heart Rate (bpm): 145   Movement: Present     General:  Alert, oriented and cooperative. Patient is in no acute distress.  Skin: Skin is warm and dry. No rash noted.   Cardiovascular: Normal heart rate noted  Respiratory: Normal respiratory effort, no problems with respiration noted  Abdomen: Soft, gravid, appropriate for gestational age. Pain/Pressure: Absent     Pelvic:  Cervical exam deferred        Extremities: Normal range of motion.  Edema: None  Mental Status: Normal mood and affect. Normal behavior. Normal judgment and thought content.   Urinalysis:      Assessment and Plan:  Pregnancy: [id number] at 3347w3d  1. Supervision of normal pregnancy in second trimester Patient is doing well without complaints Reports normal ultrasound on 8/24- report not available for review  2. History of cesarean delivery Patient desires repeat  3. ASCUS with positive high risk HPV colpo pp  General obstetric precautions including but not limited to vaginal bleeding, contractions, leaking of fluid and fetal  movement were reviewed in detail with the patient. Please refer to After Visit Summary for other counseling recommendations.  Return in about 4 weeks (around 06/23/2016).  Catalina AntiguaPeggy Jalaysha Skilton, MD

## 2016-05-26 NOTE — Progress Notes (Signed)
Pt. Has no questions or concerns at this time. °

## 2016-05-26 NOTE — Patient Instructions (Signed)
Contraception Choices Contraception (birth control) is the use of any methods or devices to prevent pregnancy. Below are some methods to help avoid pregnancy. HORMONAL METHODS   Contraceptive implant. This is a thin, plastic tube containing progesterone hormone. It does not contain estrogen hormone. Your health care provider inserts the tube in the inner part of the upper arm. The tube can remain in place for up to 3 years. After 3 years, the implant must be removed. The implant prevents the ovaries from releasing an egg (ovulation), thickens the cervical mucus to prevent sperm from entering the uterus, and thins the lining of the inside of the uterus.  Progesterone-only injections. These injections are given every 3 months by your health care provider to prevent pregnancy. This synthetic progesterone hormone stops the ovaries from releasing eggs. It also thickens cervical mucus and changes the uterine lining. This makes it harder for sperm to survive in the uterus.  Birth control pills. These pills contain estrogen and progesterone hormone. They work by preventing the ovaries from releasing eggs (ovulation). They also cause the cervical mucus to thicken, preventing the sperm from entering the uterus. Birth control pills are prescribed by a health care provider.Birth control pills can also be used to treat heavy periods.  Minipill. This type of birth control pill contains only the progesterone hormone. They are taken every day of each month and must be prescribed by your health care provider.  Birth control patch. The patch contains hormones similar to those in birth control pills. It must be changed once a week and is prescribed by a health care provider.  Vaginal ring. The ring contains hormones similar to those in birth control pills. It is left in the vagina for 3 weeks, removed for 1 week, and then a new one is put back in place. The patient must be comfortable inserting and removing the ring  from the vagina.A health care provider's prescription is necessary.  Emergency contraception. Emergency contraceptives prevent pregnancy after unprotected sexual intercourse. This pill can be taken right after sex or up to 5 days after unprotected sex. It is most effective the sooner you take the pills after having sexual intercourse. Most emergency contraceptive pills are available without a prescription. Check with your pharmacist. Do not use emergency contraception as your only form of birth control. BARRIER METHODS   Female condom. This is a thin sheath (latex or rubber) that is worn over the penis during sexual intercourse. It can be used with spermicide to increase effectiveness.  Female condom. This is a soft, loose-fitting sheath that is put into the vagina before sexual intercourse.  Diaphragm. This is a soft, latex, dome-shaped barrier that must be fitted by a health care provider. It is inserted into the vagina, along with a spermicidal jelly. It is inserted before intercourse. The diaphragm should be left in the vagina for 6 to 8 hours after intercourse.  Cervical cap. This is a round, soft, latex or plastic cup that fits over the cervix and must be fitted by a health care provider. The cap can be left in place for up to 48 hours after intercourse.  Sponge. This is a soft, circular piece of polyurethane foam. The sponge has spermicide in it. It is inserted into the vagina after wetting it and before sexual intercourse.  Spermicides. These are chemicals that kill or block sperm from entering the cervix and uterus. They come in the form of creams, jellies, suppositories, foam, or tablets. They do not require a   prescription. They are inserted into the vagina with an applicator before having sexual intercourse. The process must be repeated every time you have sexual intercourse. INTRAUTERINE CONTRACEPTION  Intrauterine device (IUD). This is a T-shaped device that is put in a woman's uterus  during a menstrual period to prevent pregnancy. There are 2 types:  Copper IUD. This type of IUD is wrapped in copper wire and is placed inside the uterus. Copper makes the uterus and fallopian tubes produce a fluid that kills sperm. It can stay in place for 10 years.  Hormone IUD. This type of IUD contains the hormone progestin (synthetic progesterone). The hormone thickens the cervical mucus and prevents sperm from entering the uterus, and it also thins the uterine lining to prevent implantation of a fertilized egg. The hormone can weaken or kill the sperm that get into the uterus. It can stay in place for 3-5 years, depending on which type of IUD is used. PERMANENT METHODS OF CONTRACEPTION  Female tubal ligation. This is when the woman's fallopian tubes are surgically sealed, tied, or blocked to prevent the egg from traveling to the uterus.  Hysteroscopic sterilization. This involves placing a small coil or insert into each fallopian tube. Your doctor uses a technique called hysteroscopy to do the procedure. The device causes scar tissue to form. This results in permanent blockage of the fallopian tubes, so the sperm cannot fertilize the egg. It takes about 3 months after the procedure for the tubes to become blocked. You must use another form of birth control for these 3 months.  Female sterilization. This is when the female has the tubes that carry sperm tied off (vasectomy).This blocks sperm from entering the vagina during sexual intercourse. After the procedure, the man can still ejaculate fluid (semen). NATURAL PLANNING METHODS  Natural family planning. This is not having sexual intercourse or using a barrier method (condom, diaphragm, cervical cap) on days the woman could become pregnant.  Calendar method. This is keeping track of the length of each menstrual cycle and identifying when you are fertile.  Ovulation method. This is avoiding sexual intercourse during ovulation.  Symptothermal  method. This is avoiding sexual intercourse during ovulation, using a thermometer and ovulation symptoms.  Post-ovulation method. This is timing sexual intercourse after you have ovulated. Regardless of which type or method of contraception you choose, it is important that you use condoms to protect against the transmission of sexually transmitted infections (STIs). Talk with your health care provider about which form of contraception is most appropriate for you.   This information is not intended to replace advice given to you by your health care provider. Make sure you discuss any questions you have with your health care provider.   Document Released: 09/13/2005 Document Revised: 09/18/2013 Document Reviewed: 03/08/2013 Elsevier Interactive Patient Education 2016 Elsevier Inc.  

## 2016-05-26 NOTE — Addendum Note (Signed)
Addended by: Lear NgMARTIN, MISTY L on: 05/26/2016 03:46 PM   Modules accepted: Orders

## 2016-06-02 ENCOUNTER — Telehealth: Payer: Self-pay | Admitting: *Deleted

## 2016-06-02 LAB — AFP, QUAD SCREEN
DIA Mom Value: 0.71
DIA Value (EIA): 125.8 pg/mL
DSR (BY AGE) 1 IN: 977
DSR (Second Trimester) 1 IN: 10000
Gestational Age: 19.4 WEEKS
MATERNAL AGE AT EDD: 26.1 a
MSAFP MOM: 0.78
MSAFP: 40.9 ng/mL
MSHCG MOM: 0.44
MSHCG: 10113 m[IU]/mL
OSB RISK: 10000
PDF: 0
T18 (By Age): 1:3806 {titer}
Test Results:: NEGATIVE
UE3 VALUE: 1.43 ng/mL
Weight: 169 [lb_av]
uE3 Mom: 0.86

## 2016-06-02 NOTE — Telephone Encounter (Signed)
Information missing from Quad Screen called to [facility].

## 2016-06-09 ENCOUNTER — Inpatient Hospital Stay (HOSPITAL_COMMUNITY)
Admission: AD | Admit: 2016-06-09 | Discharge: 2016-06-09 | Disposition: A | Discharge status: Home / Self Care | Payer: Medicaid Other | Source: Ambulatory Visit | Attending: Family Medicine | Admitting: Family Medicine

## 2016-06-09 ENCOUNTER — Encounter (HOSPITAL_COMMUNITY): Payer: Self-pay | Admitting: Student

## 2016-06-09 DIAGNOSIS — O26892 Other specified pregnancy related conditions, second trimester: Secondary | ICD-10-CM | POA: Insufficient documentation

## 2016-06-09 DIAGNOSIS — O26899 Other specified pregnancy related conditions, unspecified trimester: Secondary | ICD-10-CM

## 2016-06-09 DIAGNOSIS — O9989 Other specified diseases and conditions complicating pregnancy, childbirth and the puerperium: Secondary | ICD-10-CM

## 2016-06-09 DIAGNOSIS — Z3A21 21 weeks gestation of pregnancy: Secondary | ICD-10-CM | POA: Insufficient documentation

## 2016-06-09 DIAGNOSIS — R109 Unspecified abdominal pain: Secondary | ICD-10-CM | POA: Diagnosis present

## 2016-06-09 LAB — CBC
HEMATOCRIT: 33.3 % — AB (ref 36.0–46.0)
Hemoglobin: 11.2 g/dL — ABNORMAL LOW (ref 12.0–15.0)
MCH: 28.6 pg (ref 26.0–34.0)
MCHC: 33.6 g/dL (ref 30.0–36.0)
MCV: 84.9 fL (ref 78.0–100.0)
PLATELETS: 245 10*3/uL (ref 150–400)
RBC: 3.92 MIL/uL (ref 3.87–5.11)
RDW: 13.3 % (ref 11.5–15.5)
WBC: 8.2 10*3/uL (ref 4.0–10.5)

## 2016-06-09 LAB — URINALYSIS, ROUTINE W REFLEX MICROSCOPIC
BILIRUBIN URINE: NEGATIVE
GLUCOSE, UA: NEGATIVE mg/dL
HGB URINE DIPSTICK: NEGATIVE
Ketones, ur: NEGATIVE mg/dL
Leukocytes, UA: NEGATIVE
Nitrite: NEGATIVE
Protein, ur: NEGATIVE mg/dL
SPECIFIC GRAVITY, URINE: 1.025 (ref 1.005–1.030)
pH: 7 (ref 5.0–8.0)

## 2016-06-09 NOTE — Discharge Instructions (Signed)
Abdominal Pain During Pregnancy Belly (abdominal) pain is common during pregnancy. Most of the time, it is not a serious problem. Other times, it can be a sign that something is wrong with the pregnancy. Always tell your doctor if you have belly pain. HOME CARE Monitor your belly pain for any changes. The following actions may help you feel better:  Do not have sex (intercourse) or put anything in your vagina until you feel better.  Rest until your pain stops.  Drink clear fluids if you feel sick to your stomach (nauseous). Do not eat solid food until you feel better.  Only take medicine as told by your doctor.  Keep all doctor visits as told. GET HELP RIGHT AWAY IF:   You are bleeding, leaking fluid, or pieces of tissue come out of your vagina.  You have more pain or cramping.  You keep throwing up (vomiting).  You have pain when you pee (urinate) or have blood in your pee.  You have a fever.  You do not feel your baby moving as much.  You feel very weak or feel like passing out.  You have trouble breathing, with or without belly pain.  You have a very bad headache and belly pain.  You have fluid leaking from your vagina and belly pain.  You keep having watery poop (diarrhea).  Your belly pain does not go away after resting, or the pain gets worse. MAKE SURE YOU:   Understand these instructions.  Will watch your condition.  Will get help right away if you are not doing well or get worse.   This information is not intended to replace advice given to you by your health care provider. Make sure you discuss any questions you have with your health care provider.   Document Released: 09/01/2009 Document Revised: 05/16/2013 Document Reviewed: 04/12/2013 Elsevier Interactive Patient Education 2016 [employer]    Preterm Labor Information Preterm labor is when labor starts before you are [redacted] weeks pregnant. The normal length of pregnancy is 39 to 41 weeks.  CAUSES    The cause of preterm labor is not often known. The most common known cause is infection. RISK FACTORS  Having a history of preterm labor.  Having your water break before it should.  Having a placenta that covers the opening of the cervix.  Having a placenta that breaks away from the uterus.  Having a cervix that is too weak to hold the baby in the uterus.  Having too much fluid in the amniotic sac.  Taking drugs or smoking while pregnant.  Not gaining enough weight while pregnant.  Being younger than 6818 and older than 26 years old.  Having a low income.  Being African American. SYMPTOMS  Period-like cramps, belly (abdominal) pain, or back pain.  Contractions that are regular, as often as six in an hour. They may be mild or painful.  Contractions that start at the top of the belly. They then move to the lower belly and back.  Lower belly pressure that seems to get stronger.  Bleeding from the vagina.  Fluid leaking from the vagina. TREATMENT  Treatment depends on:  Your condition.  The condition of your baby.  How many weeks pregnant you are. Your doctor may have you:  Take medicine to stop contractions.  Stay in bed except to use the restroom (bed rest).  Stay in the hospital. WHAT SHOULD YOU DO IF YOU THINK YOU ARE IN PRETERM LABOR? Call your doctor right away.  go to the hospital right away.  °HOW CAN YOU PREVENT PRETERM LABOR IN FUTURE PREGNANCIES? °· Stop smoking, if you smoke. °· Maintain healthy weight gain. °· Do not take drugs or be around chemicals that are not needed. °· Tell your doctor if you think you have an infection. °· Tell your doctor if you had a preterm labor before. °  °This information is not intended to replace advice given to you by your health care provider. Make sure you discuss any questions you have with your health care provider. °  °Document Released: 12/10/2008 Document Revised: 01/28/2015 Document Reviewed:  10/16/2012 °Elsevier Interactive Patient Education ©2016 Elsevier Inc. ° °

## 2016-06-09 NOTE — MAU Provider Note (Signed)
History     CSN: [id number]  Arrival date and time: 06/09/16 1452   First Provider Initiated Contact with Patient 06/09/16 1539      Chief Complaint  Patient presents with  . Abdominal Pain   HPI Desha Bitner is a 25 y.o. [id number] at [redacted]w[redacted]d who presents with abdominal pain. Symptoms began yesterday. Reports lower abdominal pain that is worse in the LLQ. Pain is intermittent; occurs once every few hours and last for about 30 minutes at a time. Currently has no pain. Last time she felt the pain she rated it 10/10. Has not treated. Denies aggravating or alleviating factors. Denies vaginal bleeding, LOF, dysuria, n/v/d, constipation. Denies hx of preterm labor or delivery.   OB History    Gravida Para Term Preterm AB Living   4 1 1   2 1    SAB TAB Ectopic Multiple Live Births     2     1      Obstetric Comments   EAB x 2 (one in clinic procedure and one medical). Scheduled c-section b/c they said her pelvis was too long and narrow. Pt states she never went into labor.       Past Medical History:  Diagnosis Date  . Asthma   . History of chlamydia     Past Surgical History:  Procedure Laterality Date  . CESAREAN SECTION      Family History  Problem Relation Age of Onset  . Hypertension Mother   . Cancer Maternal Grandmother   . Stroke Neg Hx     Social History  Substance Use Topics  . Smoking status: Never Smoker  . Smokeless tobacco: Never Used  . Alcohol use Yes     Comment: not while preg    Allergies: No Known Allergies  Prescriptions Prior to Admission  Medication Sig Dispense Refill Last Dose  . albuterol (PROVENTIL HFA;VENTOLIN HFA) 108 (90 BASE) MCG/ACT inhaler Inhale 2 puffs into the lungs every 6 (six) hours as needed for wheezing or shortness of breath. (Patient not taking: Reported on 04/15/2016) 1 Inhaler 2 Not Taking  . amoxicillin-clavulanate (AUGMENTIN) 875-125 MG tablet Take 1 tablet by mouth 2 (two) times daily. 14 tablet 0   . metoCLOPramide  (REGLAN) 10 MG tablet Take 1 tablet (10 mg total) by mouth every 6 (six) hours. (Patient not taking: Reported on 04/15/2016) 30 tablet 0 Not Taking  . Pediatric Multiple Vit-C-FA (FLINSTONES GUMMIES OMEGA-3 DHA PO) Take by mouth.   Taking  . promethazine (PHENERGAN) 25 MG tablet Take 1 tablet (25 mg total) by mouth every 6 (six) hours as needed for nausea or vomiting. (Patient not taking: Reported on 04/15/2016) 30 tablet 0 Not Taking    Review of Systems  Constitutional: Negative.   Gastrointestinal: Positive for abdominal pain. Negative for constipation, diarrhea, nausea and vomiting.  Genitourinary: Negative.    Physical Exam   Blood pressure 123/66, pulse 94, temperature 98.4 F (36.9 C), temperature source Oral, resp. rate 16, last menstrual period 01/11/2016, SpO2 100 %.  Physical Exam  Nursing note and vitals reviewed. Constitutional: She is oriented to person, place, and time. She appears well-developed and well-nourished. No distress.  HENT:  Head: Normocephalic and atraumatic.  Eyes: Conjunctivae are normal. Right eye exhibits no discharge. Left eye exhibits no discharge. No scleral icterus.  Neck: Normal range of motion.  Cardiovascular: Normal rate, regular rhythm and normal heart sounds.   No murmur heard. Respiratory: Effort normal and breath sounds normal. No respiratory distress. She  has no wheezes.  GI: Soft. Bowel sounds are normal. There is no tenderness. There is no rebound.  Neurological: She is alert and oriented to person, place, and time.  Skin: Skin is warm and dry. She is not diaphoretic.  Psychiatric: She has a normal mood and affect. Her behavior is normal. Judgment and thought content normal.   Dilation: Closed Effacement (%): Thick Cervical Position: Anterior Exam by:: Judeth HornErin Tonya Carlile NP   MAU Course  Procedures Results for orders placed or performed during the hospital encounter of 06/09/16 (from the past 24 hour(s))  Urinalysis, Routine w reflex  microscopic ([facility])     Status: None   Collection Time: 06/09/16  3:07 PM  Result Value Ref Range   Color, Urine YELLOW YELLOW   APPearance CLEAR CLEAR   Specific Gravity, Urine 1.025 1.005 - 1.030   pH 7.0 5.0 - 8.0   Glucose, UA NEGATIVE NEGATIVE mg/dL   Hgb urine dipstick NEGATIVE NEGATIVE   Bilirubin Urine NEGATIVE NEGATIVE   Ketones, ur NEGATIVE NEGATIVE mg/dL   Protein, ur NEGATIVE NEGATIVE mg/dL   Nitrite NEGATIVE NEGATIVE   Leukocytes, UA NEGATIVE NEGATIVE  CBC     Status: Abnormal   Collection Time: 06/09/16  4:27 PM  Result Value Ref Range   WBC 8.2 4.0 - 10.5 K/uL   RBC 3.92 3.87 - 5.11 MIL/uL   Hemoglobin 11.2 (L) 12.0 - 15.0 g/dL   HCT 16.133.3 (L) 09.636.0 - 04.546.0 %   MCV 84.9 78.0 - 100.0 fL   MCH 28.6 26.0 - 34.0 pg   MCHC 33.6 30.0 - 36.0 g/dL   RDW 40.913.3 81.111.5 - 91.415.5 %   Platelets 245 150 - 400 K/uL    MDM FHT 141 by doppler CBC, u/a Cervix closed  Assessment and Plan  A: 1. Abdominal pain affecting pregnancy     P: Discharge home Recommend maternity support belt Take tylenol prn Discussed reasons to return to MAU Keep f/u with OB  Judeth HornErin Theseus Birnie 06/09/2016, 3:38 PM

## 2016-06-09 NOTE — MAU Note (Addendum)
Pt is having a sharp pain in the lower left of her abdomen.  Pt states the pain comes and goes.  Pt states that sometime yesterday and sometime today she has felt short of breath.

## 2016-06-23 ENCOUNTER — Ambulatory Visit (INDEPENDENT_AMBULATORY_CARE_PROVIDER_SITE_OTHER): Payer: Medicaid Other | Admitting: Obstetrics and Gynecology

## 2016-06-23 VITALS — BP 125/79 | HR 77 | Temp 99.0°F | Wt 177.2 lb

## 2016-06-23 DIAGNOSIS — Z23 Encounter for immunization: Secondary | ICD-10-CM | POA: Diagnosis not present

## 2016-06-23 DIAGNOSIS — Z98891 History of uterine scar from previous surgery: Secondary | ICD-10-CM

## 2016-06-23 DIAGNOSIS — IMO0002 Reserved for concepts with insufficient information to code with codable children: Secondary | ICD-10-CM

## 2016-06-23 DIAGNOSIS — Z3482 Encounter for supervision of other normal pregnancy, second trimester: Secondary | ICD-10-CM

## 2016-06-23 DIAGNOSIS — Z3492 Encounter for supervision of normal pregnancy, unspecified, second trimester: Secondary | ICD-10-CM

## 2016-06-23 NOTE — Progress Notes (Signed)
Patient states that she is feeling well.

## 2016-06-23 NOTE — Progress Notes (Signed)
   PRENATAL VISIT NOTE  Subjective:  Kim [name] is a 25 y.o. [phone] [id number] at 6147w3d being seen today for ongoing prenatal care.  She is currently monitored for the following issues for this low-risk pregnancy and has Supervision of normal pregnancy in second trimester; History of cesarean delivery; and ASCUS with positive high risk HPV on her problem list.  Patient reports no complaints.  Contractions: Not present. Vag. Bleeding: None.  Movement: Present. Denies leaking of fluid.   The following portions of the patient's history were reviewed and updated as appropriate: allergies, current medications, past family history, past medical history, past social history, past surgical history and problem list. Problem list updated.  Objective:   Vitals:   06/23/16 0928  BP: 125/79  Pulse: 77  Temp: 99 F (37.2 C)  Weight: 177 lb 3.2 oz (80.4 kg)    Fetal Status: Fetal Heart Rate (bpm): 147 Fundal Height: 24 cm Movement: Present     General:  Alert, oriented and cooperative. Patient is in no acute distress.  Skin: Skin is warm and dry. No rash noted.   Cardiovascular: Normal heart rate noted  Respiratory: Normal respiratory effort, no problems with respiration noted  Abdomen: Soft, gravid, appropriate for gestational age. Pain/Pressure: Absent     Pelvic:  Cervical exam deferred        Extremities: Normal range of motion.  Edema: None  Mental Status: Normal mood and affect. Normal behavior. Normal judgment and thought content.   Urinalysis:      Assessment and Plan:  Pregnancy: G4P1021 at 5947w3d  1. Supervision of normal pregnancy in second trimester Patient is doing well without complaints Agrees to flu vaccine today 2hr glucola next visit  2. History of cesarean delivery Patient desires repeat Records requested again today Patient desires c-section to be scheduled as soon as possible in order to coordinate child care  3. ASCUS with positive high risk HPV Colpo pp  Preterm  labor symptoms and general obstetric precautions including but not limited to vaginal bleeding, contractions, leaking of fluid and fetal movement were reviewed in detail with the patient. Please refer to After Visit Summary for other counseling recommendations.  Return in about 4 weeks (around 07/21/2016).  Catalina AntiguaPeggy Zurii Hewes, MD

## 2016-06-25 ENCOUNTER — Encounter: Payer: Self-pay | Admitting: Obstetrics and Gynecology

## 2016-07-01 LAB — MATERNIT 21 PLUS CORE, BLOOD
Chromosome 13: NEGATIVE
Chromosome 18: NEGATIVE
Chromosome 21: NEGATIVE
PDF: 0
Y CHROMOSOME: NOT DETECTED

## 2016-07-21 ENCOUNTER — Other Ambulatory Visit: Payer: Medicaid Other

## 2016-07-21 ENCOUNTER — Ambulatory Visit (INDEPENDENT_AMBULATORY_CARE_PROVIDER_SITE_OTHER): Payer: Medicaid Other | Admitting: Obstetrics and Gynecology

## 2016-07-21 VITALS — BP 132/82 | HR 102 | Temp 97.9°F | Wt 190.0 lb

## 2016-07-21 DIAGNOSIS — Z3482 Encounter for supervision of other normal pregnancy, second trimester: Secondary | ICD-10-CM

## 2016-07-21 DIAGNOSIS — O34219 Maternal care for unspecified type scar from previous cesarean delivery: Secondary | ICD-10-CM

## 2016-07-21 DIAGNOSIS — R87810 Cervical high risk human papillomavirus (HPV) DNA test positive: Secondary | ICD-10-CM

## 2016-07-21 DIAGNOSIS — Z98891 History of uterine scar from previous surgery: Secondary | ICD-10-CM

## 2016-07-21 DIAGNOSIS — R87610 Atypical squamous cells of undetermined significance on cytologic smear of cervix (ASC-US): Secondary | ICD-10-CM

## 2016-07-21 NOTE — Progress Notes (Signed)
Subjective:  Kim [name] is a 25 y.o. [phone] at 3854w3d being seen today for ongoing prenatal care.  She is currently monitored for the following issues for this low-risk pregnancy and has Supervision of normal pregnancy in second trimester; History of cesarean delivery; and ASCUS with positive high risk HPV cervical on her problem list.  Patient reports no complaints.  Contractions: Not present. Vag. Bleeding: None.  Movement: Present. Denies leaking of fluid.   The following portions of the patient's history were reviewed and updated as appropriate: allergies, current medications, past family history, past medical history, past social history, past surgical history and problem list. Problem list updated.  Objective:   Vitals:   07/21/16 0942  BP: 132/82  Pulse: (!) 102  Temp: 97.9 F (36.6 C)  Weight: 190 lb (86.2 kg)    Fetal Status: Fetal Heart Rate (bpm): 152 Fundal Height: 27 cm Movement: Present     General:  Alert, oriented and cooperative. Patient is in no acute distress.  Skin: Skin is warm and dry. No rash noted.   Cardiovascular: Normal heart rate noted  Respiratory: Normal respiratory effort, no problems with respiration noted  Abdomen: Soft, gravid, appropriate for gestational age. Pain/Pressure: Absent     Pelvic:  Cervical exam deferred        Extremities: Normal range of motion.  Edema: Trace  Mental Status: Normal mood and affect. Normal behavior. Normal judgment and thought content.   Urinalysis:      Assessment and Plan:  Pregnancy: G4P1021 at 6554w3d  1. Encounter for supervision of other normal pregnancy in second trimester  - Glucose Tolerance, 2 Hours w/1 Hour - CBC - HIV antibody (with reflex) - RPR  2. ASCUS with positive high risk HPV cervical Repeat PP  3. History of cesarean delivery Desires repeat. OP note reviewed LTCS Will schedule at 39 weeks to assist pt in child care, [place] from [place]  Preterm labor symptoms and general  obstetric precautions including but not limited to vaginal bleeding, contractions, leaking of fluid and fetal movement were reviewed in detail with the patient. Please refer to After Visit Summary for other counseling recommendations.  No Follow-up on file.   Hermina StaggersMichael L Ervin, MD

## 2016-07-21 NOTE — Progress Notes (Signed)
Patient is in the office, reports good fetal movement and feeling good today.

## 2016-07-22 LAB — CBC
Hematocrit: 33.4 % — ABNORMAL LOW (ref 34.0–46.6)
Hemoglobin: 11.2 g/dL (ref 11.1–15.9)
MCH: 28.6 pg (ref 26.6–33.0)
MCHC: 33.5 g/dL (ref 31.5–35.7)
MCV: 85 fL (ref 79–97)
PLATELETS: 274 10*3/uL (ref 150–379)
RBC: 3.92 x10E6/uL (ref 3.77–5.28)
RDW: 13.2 % (ref 12.3–15.4)
WBC: 8.8 10*3/uL (ref 3.4–10.8)

## 2016-07-22 LAB — GLUCOSE TOLERANCE, 2 HOURS W/ 1HR
GLUCOSE, 1 HOUR: 150 mg/dL (ref 65–179)
GLUCOSE, FASTING: 90 mg/dL (ref 65–91)
Glucose, 2 hour: 113 mg/dL (ref 65–152)

## 2016-07-22 LAB — RPR: RPR Ser Ql: NONREACTIVE

## 2016-07-22 LAB — HIV ANTIBODY (ROUTINE TESTING W REFLEX): HIV SCREEN 4TH GENERATION: NONREACTIVE

## 2016-07-23 ENCOUNTER — Encounter (HOSPITAL_COMMUNITY): Payer: Self-pay | Admitting: *Deleted

## 2016-07-30 ENCOUNTER — Other Ambulatory Visit: Payer: Self-pay | Admitting: *Deleted

## 2016-07-30 DIAGNOSIS — Z3493 Encounter for supervision of normal pregnancy, unspecified, third trimester: Secondary | ICD-10-CM

## 2016-07-30 DIAGNOSIS — R05 Cough: Secondary | ICD-10-CM

## 2016-07-30 DIAGNOSIS — R059 Cough, unspecified: Secondary | ICD-10-CM

## 2016-07-30 DIAGNOSIS — J302 Other seasonal allergic rhinitis: Secondary | ICD-10-CM

## 2016-07-30 MED ORDER — LORATADINE 10 MG PO TABS: 10.0000 mg | ORAL_TABLET | Freq: Every day | ORAL | 0 refills | Status: DC

## 2016-07-30 MED ORDER — GUAIFENESIN-DM 100-10 MG/5ML PO SYRP: 5.0000 mL | ORAL_SOLUTION | ORAL | 0 refills | Status: DC | PRN

## 2016-07-30 NOTE — Progress Notes (Signed)
Pt called to office with symptoms of cold. Pt states that she has HA, stuffy nose and cough with congestion. Pt advised of OTC meds to take for symptoms.   Pt ask if Rx could be sent due to no way to pay for OTC. Pt made aware that Rx would be sent as availabe.  Reviewed with Dr Clearance CootsHarper, Claritin and Robitussin DM was sent to pharmacy.

## 2016-08-05 ENCOUNTER — Inpatient Hospital Stay (HOSPITAL_COMMUNITY)
Admission: AD | Admit: 2016-08-05 | Discharge: 2016-08-05 | Disposition: A | Discharge status: Home / Self Care | Payer: Medicaid Other | Source: Ambulatory Visit | Attending: Obstetrics & Gynecology | Admitting: Obstetrics & Gynecology

## 2016-08-05 ENCOUNTER — Encounter (HOSPITAL_COMMUNITY): Payer: Self-pay | Admitting: *Deleted

## 2016-08-05 DIAGNOSIS — O163 Unspecified maternal hypertension, third trimester: Secondary | ICD-10-CM | POA: Diagnosis not present

## 2016-08-05 DIAGNOSIS — O99513 Diseases of the respiratory system complicating pregnancy, third trimester: Secondary | ICD-10-CM | POA: Insufficient documentation

## 2016-08-05 DIAGNOSIS — Z3689 Encounter for other specified antenatal screening: Secondary | ICD-10-CM

## 2016-08-05 DIAGNOSIS — R000 Tachycardia, unspecified: Secondary | ICD-10-CM

## 2016-08-05 DIAGNOSIS — R42 Dizziness and giddiness: Secondary | ICD-10-CM | POA: Diagnosis present

## 2016-08-05 DIAGNOSIS — J45901 Unspecified asthma with (acute) exacerbation: Secondary | ICD-10-CM | POA: Insufficient documentation

## 2016-08-05 DIAGNOSIS — Z3A29 29 weeks gestation of pregnancy: Secondary | ICD-10-CM | POA: Diagnosis not present

## 2016-08-05 LAB — PROTEIN / CREATININE RATIO, URINE
CREATININE, URINE: 132 mg/dL
PROTEIN CREATININE RATIO: 0.11 mg/mg{creat} (ref 0.00–0.15)
TOTAL PROTEIN, URINE: 15 mg/dL

## 2016-08-05 LAB — COMPREHENSIVE METABOLIC PANEL
ALK PHOS: 63 U/L (ref 38–126)
ALT: 20 U/L (ref 14–54)
ANION GAP: 11 (ref 5–15)
AST: 24 U/L (ref 15–41)
Albumin: 3.3 g/dL — ABNORMAL LOW (ref 3.5–5.0)
BUN: 5 mg/dL — ABNORMAL LOW (ref 6–20)
CALCIUM: 9 mg/dL (ref 8.9–10.3)
CO2: 21 mmol/L — AB (ref 22–32)
Chloride: 102 mmol/L (ref 101–111)
Creatinine, Ser: 0.54 mg/dL (ref 0.44–1.00)
Glucose, Bld: 134 mg/dL — ABNORMAL HIGH (ref 65–99)
Potassium: 3.7 mmol/L (ref 3.5–5.1)
SODIUM: 134 mmol/L — AB (ref 135–145)
TOTAL PROTEIN: 6.8 g/dL (ref 6.5–8.1)
Total Bilirubin: 0.6 mg/dL (ref 0.3–1.2)

## 2016-08-05 LAB — CBC WITH DIFFERENTIAL/PLATELET
Basophils Absolute: 0 10*3/uL (ref 0.0–0.1)
Basophils Relative: 0 %
EOS ABS: 0.3 10*3/uL (ref 0.0–0.7)
EOS PCT: 3 %
HCT: 32.6 % — ABNORMAL LOW (ref 36.0–46.0)
Hemoglobin: 11.3 g/dL — ABNORMAL LOW (ref 12.0–15.0)
LYMPHS ABS: 2.3 10*3/uL (ref 0.7–4.0)
Lymphocytes Relative: 23 %
MCH: 28.8 pg (ref 26.0–34.0)
MCHC: 34.7 g/dL (ref 30.0–36.0)
MCV: 83 fL (ref 78.0–100.0)
MONOS PCT: 5 %
Monocytes Absolute: 0.5 10*3/uL (ref 0.1–1.0)
Neutro Abs: 7 10*3/uL (ref 1.7–7.7)
Neutrophils Relative %: 69 %
PLATELETS: 283 10*3/uL (ref 150–400)
RBC: 3.93 MIL/uL (ref 3.87–5.11)
RDW: 13.2 % (ref 11.5–15.5)
WBC: 10.2 10*3/uL (ref 4.0–10.5)

## 2016-08-05 LAB — URINALYSIS, ROUTINE W REFLEX MICROSCOPIC
BILIRUBIN URINE: NEGATIVE
Glucose, UA: NEGATIVE mg/dL
HGB URINE DIPSTICK: NEGATIVE
Ketones, ur: NEGATIVE mg/dL
Leukocytes, UA: NEGATIVE
Nitrite: NEGATIVE
Protein, ur: NEGATIVE mg/dL
SPECIFIC GRAVITY, URINE: 1.015 (ref 1.005–1.030)
pH: 7 (ref 5.0–8.0)

## 2016-08-05 NOTE — MAU Note (Signed)
Pt reports she has ben feeling dizzy off and on all night feeling like her heart is beating fast at times. Got SOB and used her inhaler with relief.

## 2016-08-05 NOTE — Discharge Instructions (Signed)

## 2016-08-05 NOTE — MAU Provider Note (Signed)
History     CSN: [id number]  Arrival date and time: 08/05/16 4098   None     Chief Complaint  Patient presents with  . Dizziness  . Shortness of Breath  . Tachycardia   G4P1021 @29 .4 weeks here with dizziness and fast heart beat since last night. She had feelings like she would pass out but denies syncope. Symptoms were preceded by SOB for which she used an inhlaer and had improvement. She endorses frontal HAs over the last 2 weeks for which she uses and Tylenol and has relief. She denies epigastric pain and visual disturbances. She reports good FM and denies ctx, VB, and LOF. She reports adequate hydration.   OB History    Gravida Para Term Preterm AB Living   4 1 1   2 1    SAB TAB Ectopic Multiple Live Births     2     1      Obstetric Comments   EAB x 2 (one in clinic procedure and one medical). Scheduled c-section b/c they said her pelvis was too long and narrow. Pt states she never went into labor.       Past Medical History:  Diagnosis Date  . Asthma   . History of chlamydia     Past Surgical History:  Procedure Laterality Date  . CESAREAN SECTION      Family History  Problem Relation Age of Onset  . Hypertension Mother   . Cancer Maternal Grandmother   . Stroke Neg Hx     Social History  Substance Use Topics  . Smoking status: Never Smoker  . Smokeless tobacco: Never Used  . Alcohol use Yes     Comment: not while preg    Allergies: No Known Allergies  Prescriptions Prior to Admission  Medication Sig Dispense Refill Last Dose  . albuterol (PROVENTIL HFA;VENTOLIN HFA) 108 (90 BASE) MCG/ACT inhaler Inhale 2 puffs into the lungs every 6 (six) hours as needed for wheezing or shortness of breath. (Patient not taking: Reported on 07/21/2016) 1 Inhaler 2 Not Taking  . guaiFENesin-dextromethorphan (ROBITUSSIN DM) 100-10 MG/5ML syrup Take 5 mLs by mouth every 4 (four) hours as needed for cough. 118 mL 0   . loratadine (CLARITIN) 10 MG tablet Take 1 tablet  (10 mg total) by mouth daily. 30 tablet 0   . Pediatric Multiple Vit-C-FA (FLINSTONES GUMMIES OMEGA-3 DHA PO) Take by mouth.   Taking    Review of Systems  Constitutional: Negative.   Eyes: Negative for blurred vision and double vision.  Respiratory: Positive for shortness of breath.   Cardiovascular: Negative for chest pain and palpitations.  Gastrointestinal: Negative.   Neurological: Positive for dizziness.   Physical Exam   Blood pressure 132/72, pulse 106, temperature 98.3 F (36.8 C), resp. rate 18, height 5\' 1"  (1.549 m), weight 88.2 kg (194 lb 6.4 oz), last menstrual period 01/11/2016, SpO2 97 %. Vitals:   08/05/16 0831 08/05/16 0846 08/05/16 0901 08/05/16 0940  BP: 127/77 127/71 131/72 132/73  Pulse: 100 98 97 98  Resp:    18  Temp:      SpO2:    98%  Weight:      Height:        Physical Exam  Constitutional: She is oriented to person, place, and time. She appears well-developed and well-nourished.  HENT:  Head: Normocephalic and atraumatic.  Neck: Normal range of motion.  Cardiovascular: Normal rate and regular rhythm.   Respiratory: Effort normal.  GI: Soft. She  exhibits no distension. There is no tenderness.  gravid  Musculoskeletal: Normal range of motion. She exhibits no edema.  Neurological: She is alert and oriented to person, place, and time. She has normal reflexes.  Skin: Skin is warm and dry.  Psychiatric: She has a normal mood and affect.   EFM: 145 bpm, mod variability, + accels, no decels Toco: irritability  Results for orders placed or performed during the hospital encounter of 08/05/16 (from the past 24 hour(s))  Urinalysis, Routine w reflex microscopic (not at [facility])     Status: None   Collection Time: 08/05/16  7:05 AM  Result Value Ref Range   Color, Urine YELLOW YELLOW   APPearance CLEAR CLEAR   Specific Gravity, Urine 1.015 1.005 - 1.030   pH 7.0 5.0 - 8.0   Glucose, UA NEGATIVE NEGATIVE mg/dL   Hgb urine dipstick NEGATIVE NEGATIVE    Bilirubin Urine NEGATIVE NEGATIVE   Ketones, ur NEGATIVE NEGATIVE mg/dL   Protein, ur NEGATIVE NEGATIVE mg/dL   Nitrite NEGATIVE NEGATIVE   Leukocytes, UA NEGATIVE NEGATIVE  Protein / creatinine ratio, urine     Status: None   Collection Time: 08/05/16  7:10 AM  Result Value Ref Range   Creatinine, Urine 132.00 mg/dL   Total Protein, Urine 15 mg/dL   Protein Creatinine Ratio 0.11 0.00 - 0.15 mg/mg[Cre]  CBC with Differential/Platelet     Status: Abnormal   Collection Time: 08/05/16  7:12 AM  Result Value Ref Range   WBC 10.2 4.0 - 10.5 K/uL   RBC 3.93 3.87 - 5.11 MIL/uL   Hemoglobin 11.3 (L) 12.0 - 15.0 g/dL   HCT 82.932.6 (L) 56.236.0 - 13.046.0 %   MCV 83.0 78.0 - 100.0 fL   MCH 28.8 26.0 - 34.0 pg   MCHC 34.7 30.0 - 36.0 g/dL   RDW 86.513.2 78.411.5 - 69.615.5 %   Platelets 283 150 - 400 K/uL   Neutrophils Relative % 69 %   Neutro Abs 7.0 1.7 - 7.7 K/uL   Lymphocytes Relative 23 %   Lymphs Abs 2.3 0.7 - 4.0 K/uL   Monocytes Relative 5 %   Monocytes Absolute 0.5 0.1 - 1.0 K/uL   Eosinophils Relative 3 %   Eosinophils Absolute 0.3 0.0 - 0.7 K/uL   Basophils Relative 0 %   Basophils Absolute 0.0 0.0 - 0.1 K/uL  Comprehensive metabolic panel     Status: Abnormal   Collection Time: 08/05/16  7:12 AM  Result Value Ref Range   Sodium 134 (L) 135 - 145 mmol/L   Potassium 3.7 3.5 - 5.1 mmol/L   Chloride 102 101 - 111 mmol/L   CO2 21 (L) 22 - 32 mmol/L   Glucose, Bld 134 (H) 65 - 99 mg/dL   BUN 5 (L) 6 - 20 mg/dL   Creatinine, Ser 2.950.54 0.44 - 1.00 mg/dL   Calcium 9.0 8.9 - 28.410.3 mg/dL   Total Protein 6.8 6.5 - 8.1 g/dL   Albumin 3.3 (L) 3.5 - 5.0 g/dL   AST 24 15 - 41 U/L   ALT 20 14 - 54 U/L   Alkaline Phosphatase 63 38 - 126 U/L   Total Bilirubin 0.6 0.3 - 1.2 mg/dL   GFR calc non Af Amer >60 >60 mL/min   GFR calc Af Amer >60 >60 mL/min   Anion gap 11 5 - 15    MAU Course  Procedures Po hydration  MDM Labs and EKG ordered and reviewed. EKG normal. Borderline mildly elevated BPs x  4, all  others nml. No evidence of pre-e or acute cardiac process. Asthma exacerbation and use of inhaler or dehydration may have initiated the symptoms. Stable for discharge home.   Assessment and Plan   1. Hypertension affecting pregnancy in third trimester   2. NST (non-stress test) reactive   3. Tachycardia    Discharge home Pre-e precautions Return for worsening sx Follow up as scheduled in office next week    Medication List    STOP taking these medications   guaiFENesin-dextromethorphan 100-10 MG/5ML syrup Commonly known as:  ROBITUSSIN DM     TAKE these medications   albuterol 108 (90 Base) MCG/ACT inhaler Commonly known as:  PROVENTIL HFA;VENTOLIN HFA Inhale 2 puffs into the lungs every 6 (six) hours as needed for wheezing or shortness of breath.   FLINSTONES GUMMIES OMEGA-3 DHA PO Take by mouth.   loratadine 10 MG tablet Commonly known as:  CLARITIN Take 1 tablet (10 mg total) by mouth daily.      Donette LarryMelanie Izaia Say, CNM 08/05/2016, 8:09 AM

## 2016-08-11 ENCOUNTER — Encounter: Payer: Medicaid Other | Admitting: Obstetrics and Gynecology

## 2016-09-06 ENCOUNTER — Ambulatory Visit (INDEPENDENT_AMBULATORY_CARE_PROVIDER_SITE_OTHER): Payer: Medicaid Other | Admitting: Obstetrics and Gynecology

## 2016-09-06 VITALS — BP 120/82 | HR 90 | Wt 196.8 lb

## 2016-09-06 DIAGNOSIS — R87610 Atypical squamous cells of undetermined significance on cytologic smear of cervix (ASC-US): Secondary | ICD-10-CM

## 2016-09-06 DIAGNOSIS — O34219 Maternal care for unspecified type scar from previous cesarean delivery: Secondary | ICD-10-CM

## 2016-09-06 DIAGNOSIS — Z98891 History of uterine scar from previous surgery: Secondary | ICD-10-CM

## 2016-09-06 DIAGNOSIS — R87810 Cervical high risk human papillomavirus (HPV) DNA test positive: Secondary | ICD-10-CM

## 2016-09-06 DIAGNOSIS — Z3482 Encounter for supervision of other normal pregnancy, second trimester: Secondary | ICD-10-CM

## 2016-09-06 DIAGNOSIS — Z3483 Encounter for supervision of other normal pregnancy, third trimester: Secondary | ICD-10-CM

## 2016-09-06 NOTE — Progress Notes (Signed)
   PRENATAL VISIT NOTE  Subjective:  Kim [name] is a 25 y.o. G4P1021 at 4950w1d being seen today for ongoing prenatal care.  She is currently monitored for the following issues for this low-risk pregnancy and has Supervision of normal pregnancy in second trimester; History of cesarean delivery; and ASCUS with positive high risk HPV cervical on her problem list.  Patient reports no complaints.  Contractions: Irritability. Vag. Bleeding: None.  Movement: Present. Denies leaking of fluid.   The following portions of the patient's history were reviewed and updated as appropriate: allergies, current medications, past family history, past medical history, past social history, past surgical history and problem list. Problem list updated.  Objective:   Vitals:   09/06/16 1505  BP: 120/82  Pulse: 90  Weight: 196 lb 12.8 oz (89.3 kg)    Fetal Status: Fetal Heart Rate (bpm): 130 Fundal Height: 34 cm Movement: Present     General:  Alert, oriented and cooperative. Patient is in no acute distress.  Skin: Skin is warm and dry. No rash noted.   Cardiovascular: Normal heart rate noted  Respiratory: Normal respiratory effort, no problems with respiration noted  Abdomen: Soft, gravid, appropriate for gestational age. Pain/Pressure: Absent     Pelvic:  Cervical exam deferred        Extremities: Normal range of motion.  Edema: Mild pitting, slight indentation  Mental Status: Normal mood and affect. Normal behavior. Normal judgment and thought content.   Assessment and Plan:  Pregnancy: G4P1021 at 3450w1d  1. ASCUS with positive high risk HPV cervical colpo pp  2. Encounter for supervision of other normal pregnancy in second trimester Patient is doing well without complaints She was unaware that she missed several appointments but reports feeling well over the past few weeks  3. History of cesarean delivery Patient scheduled for repeat on 10/10/2016  Preterm labor symptoms and general obstetric  precautions including but not limited to vaginal bleeding, contractions, leaking of fluid and fetal movement were reviewed in detail with the patient. Please refer to After Visit Summary for other counseling recommendations.  Return in about 2 weeks (around 09/20/2016).   Catalina AntiguaPeggy Kym Scannell, MD

## 2016-09-07 ENCOUNTER — Inpatient Hospital Stay (HOSPITAL_COMMUNITY)
Admission: AD | Admit: 2016-09-07 | Discharge: 2016-09-07 | Disposition: A | Discharge status: Home / Self Care | Payer: Medicaid Other | Source: Ambulatory Visit | Attending: Obstetrics and Gynecology | Admitting: Obstetrics and Gynecology

## 2016-09-07 ENCOUNTER — Encounter (HOSPITAL_COMMUNITY): Payer: Self-pay | Admitting: *Deleted

## 2016-09-07 DIAGNOSIS — Z3A34 34 weeks gestation of pregnancy: Secondary | ICD-10-CM | POA: Diagnosis not present

## 2016-09-07 DIAGNOSIS — O4703 False labor before 37 completed weeks of gestation, third trimester: Secondary | ICD-10-CM | POA: Diagnosis not present

## 2016-09-07 DIAGNOSIS — Z3493 Encounter for supervision of normal pregnancy, unspecified, third trimester: Secondary | ICD-10-CM | POA: Insufficient documentation

## 2016-09-07 DIAGNOSIS — Z98891 History of uterine scar from previous surgery: Secondary | ICD-10-CM

## 2016-09-07 DIAGNOSIS — O133 Gestational [pregnancy-induced] hypertension without significant proteinuria, third trimester: Secondary | ICD-10-CM | POA: Diagnosis not present

## 2016-09-07 DIAGNOSIS — O163 Unspecified maternal hypertension, third trimester: Secondary | ICD-10-CM

## 2016-09-07 HISTORY — DX: Headache: R51

## 2016-09-07 HISTORY — DX: Gestational (pregnancy-induced) hypertension without significant proteinuria, unspecified trimester: O13.9

## 2016-09-07 HISTORY — DX: Unspecified infectious disease: B99.9

## 2016-09-07 HISTORY — DX: Headache, unspecified: R51.9

## 2016-09-07 HISTORY — DX: Anxiety disorder, unspecified: F41.9

## 2016-09-07 LAB — URINALYSIS, ROUTINE W REFLEX MICROSCOPIC
BILIRUBIN URINE: NEGATIVE
Glucose, UA: NEGATIVE mg/dL
Hgb urine dipstick: NEGATIVE
Ketones, ur: NEGATIVE mg/dL
Leukocytes, UA: NEGATIVE
NITRITE: NEGATIVE
PH: 7 (ref 5.0–8.0)
Protein, ur: NEGATIVE mg/dL
SPECIFIC GRAVITY, URINE: 1.017 (ref 1.005–1.030)

## 2016-09-07 LAB — COMPREHENSIVE METABOLIC PANEL
ALBUMIN: 3.5 g/dL (ref 3.5–5.0)
ALT: 17 U/L (ref 14–54)
ANION GAP: 9 (ref 5–15)
AST: 19 U/L (ref 15–41)
Alkaline Phosphatase: 92 U/L (ref 38–126)
BUN: 6 mg/dL (ref 6–20)
CO2: 22 mmol/L (ref 22–32)
Calcium: 8.9 mg/dL (ref 8.9–10.3)
Chloride: 101 mmol/L (ref 101–111)
Creatinine, Ser: 0.6 mg/dL (ref 0.44–1.00)
GFR calc non Af Amer: >60 mL/min (ref >60)
GLUCOSE: 91 mg/dL (ref 65–99)
POTASSIUM: 4.2 mmol/L (ref 3.5–5.1)
SODIUM: 132 mmol/L — AB (ref 135–145)
TOTAL PROTEIN: 7.5 g/dL (ref 6.5–8.1)
Total Bilirubin: 0.3 mg/dL (ref 0.3–1.2)

## 2016-09-07 LAB — CBC
HCT: 34.2 % — ABNORMAL LOW (ref 36.0–46.0)
Hemoglobin: 11.3 g/dL — ABNORMAL LOW (ref 12.0–15.0)
MCH: 27 pg (ref 26.0–34.0)
MCHC: 33 g/dL (ref 30.0–36.0)
MCV: 81.8 fL (ref 78.0–100.0)
Platelets: 306 10*3/uL (ref 150–400)
RBC: 4.18 MIL/uL (ref 3.87–5.11)
RDW: 13.4 % (ref 11.5–15.5)
WBC: 11.4 10*3/uL — ABNORMAL HIGH (ref 4.0–10.5)

## 2016-09-07 LAB — AMNISURE RUPTURE OF MEMBRANE (ROM) NOT AT ARMC: AMNISURE: NEGATIVE

## 2016-09-07 LAB — PROTEIN / CREATININE RATIO, URINE
Creatinine, Urine: 110 mg/dL
PROTEIN CREATININE RATIO: 0.15 mg/mg{creat} (ref 0.00–0.15)
TOTAL PROTEIN, URINE: 17 mg/dL

## 2016-09-07 LAB — WET PREP, GENITAL
CLUE CELLS WET PREP: NONE SEEN
Sperm: NONE SEEN
TRICH WET PREP: NONE SEEN
Yeast Wet Prep HPF POC: NONE SEEN

## 2016-09-07 NOTE — Discharge Instructions (Signed)
Braxton Hicks Contractions °Contractions of the uterus can occur throughout pregnancy. Contractions are not always a sign that you are in labor.  °WHAT ARE BRAXTON HICKS CONTRACTIONS?  °Contractions that occur before labor are called Braxton Hicks contractions, or false labor. Toward the end of pregnancy (32-34 weeks), these contractions can develop more often and may become more forceful. This is not true labor because these contractions do not result in opening (dilatation) and thinning of the cervix. They are sometimes difficult to tell apart from true labor because these contractions can be forceful and people have different pain tolerances. You should not feel embarrassed if you go to the hospital with false labor. Sometimes, the only way to tell if you are in true labor is for your health care provider to look for changes in the cervix. °If there are no prenatal problems or other health problems associated with the pregnancy, it is completely safe to be sent home with false labor and await the onset of true labor. °HOW CAN YOU TELL THE DIFFERENCE BETWEEN TRUE AND FALSE LABOR? °False Labor  °· The contractions of false labor are usually shorter and not as hard as those of true labor.   °· The contractions are usually irregular.   °· The contractions are often felt in the front of the lower abdomen and in the groin.   °· The contractions may go away when you walk around or change positions while lying down.   °· The contractions get weaker and are shorter lasting as time goes on.   °· The contractions do not usually become progressively stronger, regular, and closer together as with true labor.   °True Labor  °· Contractions in true labor last 30-70 seconds, become very regular, usually become more intense, and increase in frequency.   °· The contractions do not go away with walking.   °· The discomfort is usually felt in the top of the uterus and spreads to the lower abdomen and low back.   °· True labor can be  determined by your health care provider with an exam. This will show that the cervix is dilating and getting thinner.   °WHAT TO REMEMBER °· Keep up with your usual exercises and follow other instructions given by your health care provider.   °· Take medicines as directed by your health care provider.   °· Keep your regular prenatal appointments.   °· Eat and drink lightly if you think you are going into labor.   °· If Braxton Hicks contractions are making you uncomfortable:   °¨ Change your position from lying down or resting to walking, or from walking to resting.   °¨ Sit and rest in a tub of warm water.   °¨ Drink 2-3 glasses of water. Dehydration may cause these contractions.   °¨ Do slow and deep breathing several times an hour.   °WHEN SHOULD I SEEK IMMEDIATE MEDICAL CARE? °Seek immediate medical care if: °· Your contractions become stronger, more regular, and closer together.   °· You have fluid leaking or gushing from your vagina.   °· You have a fever.   °· You pass blood-tinged mucus.   °· You have vaginal bleeding.   °· You have continuous abdominal pain.   °· You have low back pain that you never had before.   °· You feel your baby's head pushing down and causing pelvic pressure.   °· Your baby is not moving as much as it used to.   °This information is not intended to replace advice given to you by your health care provider. Make sure you discuss any questions you have with your health care   provider. °Document Released: 09/13/2005 Document Revised: 01/05/2016 Document Reviewed: 06/25/2013 °Elsevier Interactive Patient Education © 2017 Elsevier Inc. ° ° ° °Hypertension During Pregnancy °Hypertension, commonly called high blood pressure, is when the force of blood pumping through your arteries is too strong. Arteries are blood vessels that carry blood from the heart throughout the body. Hypertension during pregnancy can cause problems for you and your baby. Your baby may be born early (prematurely) or  may not weigh as much as he or she should at birth. Very bad cases of hypertension during pregnancy can be life-threatening. °Different types of hypertension can occur during pregnancy. These include: °· Chronic hypertension. This happens when: °¨ You have hypertension before pregnancy and it continues during pregnancy. °¨ You develop hypertension before you are [redacted] weeks pregnant, and it continues during pregnancy. °· Gestational hypertension. This is hypertension that develops after the 20th week of pregnancy. °· Preeclampsia, also called toxemia of pregnancy. This is a very serious type of hypertension that develops only during pregnancy. It affects the whole body, and it can be very dangerous for you and your baby. °Gestational hypertension and preeclampsia usually go away within 6 weeks after your baby is born. Women who have hypertension during pregnancy have a greater chance of developing hypertension later in life or during future pregnancies. °What are the causes? °The exact cause of hypertension is not known. °What increases the risk? °There are certain factors that make it more likely for you to develop hypertension during pregnancy. These include: °· Having hypertension during a previous pregnancy or prior to pregnancy. °· Being overweight. °· Being older than age 40. °· Being pregnant for the first time or being pregnant with more than one baby. °· Becoming pregnant using fertilization methods such as IVF (in vitro fertilization). °· Having diabetes, kidney problems, or systemic lupus erythematosus. °· Having a family history of hypertension. °What are the signs or symptoms? °Chronic hypertension and gestational hypertension rarely cause symptoms. Preeclampsia causes symptoms, which may include: °· Increased protein in your urine. Your health care provider will check for this at every visit before you give birth (prenatal visit). °· Severe headaches. °· Sudden weight gain. °· Swelling of the hands, face,  legs, and feet. °· Nausea and vomiting. °· Vision problems, such as blurred or double vision. °· Numbness in the face, arms, legs, and feet. °· Dizziness. °· Slurred speech. °· Sensitivity to bright lights. °· Abdominal pain. °· Convulsions. °How is this diagnosed? °You may be diagnosed with hypertension during a routine prenatal exam. At each prenatal visit, you may: °· Have a urine test to check for high amounts of protein in your urine. °· Have your blood pressure checked. A blood pressure reading is recorded as two numbers, such as "120 over 80" (or 120/80). The first ("top") number is called the systolic pressure. It is a measure of the pressure in your arteries when your heart beats. The second ("bottom") number is called the diastolic pressure. It is a measure of the pressure in your arteries as your heart relaxes between beats. Blood pressure is measured in a unit called mm Hg. A normal blood pressure reading is: °¨ Systolic: below 120. °¨ Diastolic: below 80. °The type of hypertension that you are diagnosed with depends on your test results and when your symptoms developed. °· Chronic hypertension is usually diagnosed before 20 weeks of pregnancy. °· Gestational hypertension is usually diagnosed after 20 weeks of pregnancy. °· Hypertension with high amounts of protein in the urine   is diagnosed as preeclampsia. °· Blood pressure measurements that stay above 160 systolic, or above 110 diastolic, are signs of severe preeclampsia. °How is this treated? °Treatment for hypertension during pregnancy varies depending on the type of hypertension you have and how serious it is. °· If you take medicines called ACE inhibitors to treat chronic hypertension, you may need to switch medicines. ACE inhibitors should not be taken during pregnancy. °· If you have gestational hypertension, you may need to take blood pressure medicine. °· If you are at risk for preeclampsia, your health care provider may recommend that you take  a low-dose aspirin every day to prevent high blood pressure during your pregnancy. °· If you have severe preeclampsia, you may need to be hospitalized so you and your baby can be monitored closely. You may also need to take medicine (magnesium sulfate) to prevent seizures and to lower blood pressure. This medicine may be given as an injection or through an IV tube. °· In some cases, if your condition gets worse, you may need to deliver your baby early. °Follow these instructions at home: °Eating and drinking °· Drink enough fluid to keep your urine clear or pale yellow. °· Eat a healthy diet that is low in salt (sodium). Do not add salt to your food. Check food labels to see how much sodium a food or beverage contains. °Lifestyle °· Do not use any products that contain nicotine or tobacco, such as cigarettes and e-cigarettes. If you need help quitting, ask your health care provider. °· Do not use alcohol. °· Avoid caffeine. °· Avoid stress as much as possible. Rest and get plenty of sleep. °General instructions °· Take over-the-counter and prescription medicines only as told by your health care provider. °· While lying down, lie on your left side. This keeps pressure off your baby. °· While sitting or lying down, raise (elevate) your feet. Try putting some pillows under your lower legs. °· Exercise regularly. Ask your health care provider what kinds of exercise are best for you. °· Keep all prenatal and follow-up visits as told by your health care provider. This is important. °Contact a health care provider if: °· You have symptoms that your health care provider told you may require more treatment or monitoring, such as: °¨ Fever. °¨ Vomiting. °¨ Headache. °Get help right away if: °· You have severe abdominal pain or vomiting that does not get better with treatment. °· You suddenly develop swelling in your hands, ankles, or face. °· You gain 4 lbs (1.8 kg) or more in 1 week. °· You develop vaginal bleeding, or you  have blood in your urine. °· You do not feel your baby moving as much as usual. °· You have blurred or double vision. °· You have muscle twitching or sudden tightening (spasms). °· You have shortness of breath. °· Your lips or fingernails turn blue. °This information is not intended to replace advice given to you by your health care provider. Make sure you discuss any questions you have with your health care provider. °Document Released: 06/01/2011 Document Revised: 04/02/2016 Document Reviewed: 02/27/2016 °Elsevier Interactive Patient Education © 2017 Elsevier Inc. ° °

## 2016-09-07 NOTE — MAU Note (Signed)
Started leaking clear fluid at 1800, called office told to come in. Started contracting this morning around 0500.  Continued to leak clear fluid

## 2016-09-07 NOTE — MAU Note (Signed)
EFM [id number] on wrong chart, listed under mrn [id number]

## 2016-09-07 NOTE — MAU Provider Note (Signed)
Chief Complaint:  Labor Eval   None     HPI: Kim [name] is a 25 y.o. [phone] at [id number] presents to maternity admissions reporting leakage of clear fluid enough to soak her underwear x 2 but not requiring a pad starting last night and onset of painful contractions this morning, worsening over time.  She has hx of C/S x 1 and desires repeat C/S with this pregnancy.  She reports that nothing makes the pain worse, and she has tried position changes, warm shower, and resting this morning but pain is still worsening.  The pain is cramping in her whole abdomen/uterus, intermittent every 5-10 minutes.  She also reports less fetal movement since the pain started but has felt movement since being in MAU today.  She denies h/a, epigastric pain, or visual disturbances. She denies LOF, vaginal bleeding, vaginal itching/burning, urinary symptoms, h/a, dizziness, n/v, or fever/chills.    HPI  Past Medical History: Past Medical History:  Diagnosis Date  . Anxiety   . Asthma   . Headache   . History of chlamydia   . Infection    UTI  . Pregnancy induced hypertension    post partem    Past obstetric history: OB History  Gravida Para Term Preterm AB Living  4 1 1   2 1   SAB TAB Ectopic Multiple Live Births    2     1    # Outcome Date GA Lbr Len/2nd Weight Sex Delivery Anes PTL Lv  4 Current           3 Term 04/09/11   8 lb 5 oz (3.771 kg) F CS-Unspec EPI  LIV  2 TAB           1 TAB             Obstetric Comments  EAB x 2 (one in clinic procedure and one medical). Scheduled c-section b/c they said her pelvis was too long and narrow. Pt states she never went into labor.     Past Surgical History: Past Surgical History:  Procedure Laterality Date  . CESAREAN SECTION      Family History: Family History  Problem Relation Age of Onset  . Hypertension Mother   . Cancer Maternal Grandmother   . Drug abuse Father     died from overdose  . Stroke Neg Hx     Social History: Social  History  Substance Use Topics  . Smoking status: Never Smoker  . Smokeless tobacco: Never Used  . Alcohol use Yes     Comment: not while preg    Allergies: No Known Allergies  Meds:  Prescriptions Prior to Admission  Medication Sig Dispense Refill Last Dose  . albuterol (PROVENTIL HFA;VENTOLIN HFA) 108 (90 BASE) MCG/ACT inhaler Inhale 2 puffs into the lungs every 6 (six) hours as needed for wheezing or shortness of breath. 1 Inhaler 2 Past Month at Unknown time  . Pediatric Multiple Vit-C-FA (FLINSTONES GUMMIES OMEGA-3 DHA PO) Take by mouth.   09/06/2016 at Unknown time  . loratadine (CLARITIN) 10 MG tablet Take 1 tablet (10 mg total) by mouth daily. (Patient not taking: Reported on 09/07/2016) 30 tablet 0 Not Taking at Unknown time    ROS:  Review of Systems  Constitutional: Negative for chills, fatigue and fever.  Eyes: Negative for visual disturbance.  Respiratory: Negative for shortness of breath.   Cardiovascular: Negative for chest pain.  Gastrointestinal: Negative for abdominal pain, nausea and vomiting.  Genitourinary: Negative for difficulty  urinating, dysuria, flank pain, pelvic pain, vaginal bleeding, vaginal discharge and vaginal pain.  Neurological: Negative for dizziness and headaches.  Psychiatric/Behavioral: Negative.      I have reviewed patient's Past Medical Hx, Surgical Hx, Family Hx, Social Hx, medications and allergies.   Physical Exam   Patient Vitals for the past 24 hrs:  BP Temp Temp src Pulse Resp Height Weight  09/07/16 0958 114/61 - - 100 - - -  09/07/16 0943 123/57 - - 100 - - -  09/07/16 0928 123/67 - - 107 - - -  09/07/16 0913 132/77 - - 107 - - -  09/07/16 0900 148/92 - - 117 - - -  09/07/16 0843 149/86 - - 99 - - -  09/07/16 0832 140/88 - - 107 - - -  09/07/16 0828 136/84 - - 106 - - -  09/07/16 0826 138/93 - - 103 - - -  09/07/16 0816 147/82 98.4 F (36.9 C) Oral 104 18 5\' 1"  (1.549 m) 197 lb 9.6 oz (89.6 kg)   Constitutional:  Well-developed, well-nourished female in no acute distress.  Cardiovascular: normal rate Respiratory: normal effort GI: Abd soft, non-tender, gravid appropriate for gestational age.  MS: Extremities nontender, no edema, normal ROM Neurologic: Alert and oriented x 4.  GU: Neg CVAT.   Dilation: Closed Effacement (%): Thick Cervical Position: Anterior Exam by:: L. Leftwich-Kirby CNM  FHT:  Baseline 135 , moderate variability, accelerations present, no decelerations Contractions: 10-30 minutes with some irritability in between, mild to palpation   Labs: Results for orders placed or performed during the hospital encounter of 09/07/16 (from the past 24 hour(s))  CBC     Status: Abnormal   Collection Time: 09/07/16  8:53 AM  Result Value Ref Range   WBC 11.4 (H) 4.0 - 10.5 K/uL   RBC 4.18 3.87 - 5.11 MIL/uL   Hemoglobin 11.3 (L) 12.0 - 15.0 g/dL   HCT 16.134.2 (L) 09.636.0 - 04.546.0 %   MCV 81.8 78.0 - 100.0 fL   MCH 27.0 26.0 - 34.0 pg   MCHC 33.0 30.0 - 36.0 g/dL   RDW 40.913.4 81.111.5 - 91.415.5 %   Platelets 306 150 - 400 K/uL  Comprehensive metabolic panel     Status: Abnormal   Collection Time: 09/07/16  8:53 AM  Result Value Ref Range   Sodium 132 (L) 135 - 145 mmol/L   Potassium 4.2 3.5 - 5.1 mmol/L   Chloride 101 101 - 111 mmol/L   CO2 22 22 - 32 mmol/L   Glucose, Bld 91 65 - 99 mg/dL   BUN 6 6 - 20 mg/dL   Creatinine, Ser 7.820.60 0.44 - 1.00 mg/dL   Calcium 8.9 8.9 - 95.610.3 mg/dL   Total Protein 7.5 6.5 - 8.1 g/dL   Albumin 3.5 3.5 - 5.0 g/dL   AST 19 15 - 41 U/L   ALT 17 14 - 54 U/L   Alkaline Phosphatase 92 38 - 126 U/L   Total Bilirubin 0.3 0.3 - 1.2 mg/dL   GFR calc non Af Amer >60 >60 mL/min   GFR calc Af Amer >60 >60 mL/min   Anion gap 9 5 - 15  Protein / creatinine ratio, urine     Status: None   Collection Time: 09/07/16  9:02 AM  Result Value Ref Range   Creatinine, Urine 110.00 mg/dL   Total Protein, Urine 17 mg/dL   Protein Creatinine Ratio 0.15 0.00 - 0.15 mg/mg[Cre]   Urinalysis, Routine w reflex microscopic  Status: Abnormal   Collection Time: 09/07/16  9:02 AM  Result Value Ref Range   Color, Urine YELLOW YELLOW   APPearance CLOUDY (A) CLEAR   Specific Gravity, Urine 1.017 1.005 - 1.030   pH 7.0 5.0 - 8.0   Glucose, UA NEGATIVE NEGATIVE mg/dL   Hgb urine dipstick NEGATIVE NEGATIVE   Bilirubin Urine NEGATIVE NEGATIVE   Ketones, ur NEGATIVE NEGATIVE mg/dL   Protein, ur NEGATIVE NEGATIVE mg/dL   Nitrite NEGATIVE NEGATIVE   Leukocytes, UA NEGATIVE NEGATIVE  Amnisure rupture of membrane (rom)not at [facility]     Status: None   Collection Time: 09/07/16  9:12 AM  Result Value Ref Range   Amnisure ROM NEGATIVE   Wet prep, genital     Status: Abnormal   Collection Time: 09/07/16  9:12 AM  Result Value Ref Range   Yeast Wet Prep HPF POC NONE SEEN NONE SEEN   Trich, Wet Prep NONE SEEN NONE SEEN   Clue Cells Wet Prep HPF POC NONE SEEN NONE SEEN   WBC, Wet Prep HPF POC FEW (A) NONE SEEN   Sperm NONE SEEN    --/--/O POS, O POS (05/15 1620)  Imaging:  No results found.  MAU Course/MDM: I have ordered labs and reviewed results.  NST reviewed No evidence of preterm labor today with no cervical change in 2 hours in MAU Preeclampsia labs wnl and normal BP after first 2-3 taken in MAU. Preeclampsia precautions reviewed/pt to f/u at [facility] as scheduled.  Pt stable at time of discharge.  Today's evaluation included a work-up for preterm labor which can be life-threatening for both mom and baby.  Assessment: 1. Elevated blood pressure affecting pregnancy in third trimester, antepartum   2. Threatened preterm labor, third trimester   3. History of cesarean delivery     Plan: Discharge home Labor precautions and fetal kick counts Follow-up Information    [facility]. Call.   Why:  For nurse visit for BP check this week, keep other scheduled appointments, return to MAU as needed for signs of labor or emergencies Contact information: [address] [place] [address] [phone]           Medication List    STOP taking these medications   loratadine 10 MG tablet Commonly known as:  CLARITIN     TAKE these medications   albuterol 108 (90 Base) MCG/ACT inhaler Commonly known as:  PROVENTIL HFA;VENTOLIN HFA Inhale 2 puffs into the lungs every 6 (six) hours as needed for wheezing or shortness of breath.   FLINSTONES GUMMIES OMEGA-3 DHA PO Take by mouth.       Sharen Counter Certified Nurse-Midwife 09/07/2016 11:15 AM

## 2016-09-10 ENCOUNTER — Ambulatory Visit: Payer: Medicaid Other

## 2016-09-10 VITALS — BP 113/76 | HR 84 | Wt 196.0 lb

## 2016-09-10 DIAGNOSIS — Z0130 Encounter for examination of blood pressure without abnormal findings: Secondary | ICD-10-CM

## 2016-09-10 NOTE — Progress Notes (Signed)
Pt presents for BP check. BP 113/76, pulse 84 today.

## 2016-09-18 ENCOUNTER — Inpatient Hospital Stay (HOSPITAL_COMMUNITY)
Admission: AD | Admit: 2016-09-18 | Discharge: 2016-09-19 | Disposition: A | Discharge status: Home / Self Care | Payer: Medicaid Other | Source: Ambulatory Visit | Attending: Obstetrics & Gynecology | Admitting: Obstetrics & Gynecology

## 2016-09-18 ENCOUNTER — Encounter (HOSPITAL_COMMUNITY): Payer: Self-pay | Admitting: *Deleted

## 2016-09-18 DIAGNOSIS — O26893 Other specified pregnancy related conditions, third trimester: Secondary | ICD-10-CM | POA: Insufficient documentation

## 2016-09-18 DIAGNOSIS — Z3689 Encounter for other specified antenatal screening: Secondary | ICD-10-CM

## 2016-09-18 DIAGNOSIS — O139 Gestational [pregnancy-induced] hypertension without significant proteinuria, unspecified trimester: Secondary | ICD-10-CM

## 2016-09-18 DIAGNOSIS — O133 Gestational [pregnancy-induced] hypertension without significant proteinuria, third trimester: Secondary | ICD-10-CM

## 2016-09-18 DIAGNOSIS — O163 Unspecified maternal hypertension, third trimester: Secondary | ICD-10-CM | POA: Insufficient documentation

## 2016-09-18 DIAGNOSIS — Z3A36 36 weeks gestation of pregnancy: Secondary | ICD-10-CM | POA: Insufficient documentation

## 2016-09-18 DIAGNOSIS — M549 Dorsalgia, unspecified: Secondary | ICD-10-CM | POA: Insufficient documentation

## 2016-09-18 NOTE — MAU Note (Signed)
Having contractions for wks but regular since 1600. Denies LOF or bleeding. Some lower back pain. HTN and bp was 129/113. Just left work at 2300. Works at [employer]. Has headache and is lightheaded

## 2016-09-18 NOTE — Progress Notes (Signed)
Dr Chales Abrahamsyson in dept and aware of pt's admission and status.

## 2016-09-19 DIAGNOSIS — O139 Gestational [pregnancy-induced] hypertension without significant proteinuria, unspecified trimester: Secondary | ICD-10-CM

## 2016-09-19 DIAGNOSIS — Z3A36 36 weeks gestation of pregnancy: Secondary | ICD-10-CM

## 2016-09-19 DIAGNOSIS — Z3689 Encounter for other specified antenatal screening: Secondary | ICD-10-CM | POA: Diagnosis not present

## 2016-09-19 DIAGNOSIS — O133 Gestational [pregnancy-induced] hypertension without significant proteinuria, third trimester: Secondary | ICD-10-CM

## 2016-09-19 LAB — COMPREHENSIVE METABOLIC PANEL
ALBUMIN: 3.2 g/dL — AB (ref 3.5–5.0)
ALT: 17 U/L (ref 14–54)
ANION GAP: 9 (ref 5–15)
AST: 19 U/L (ref 15–41)
Alkaline Phosphatase: 104 U/L (ref 38–126)
BUN: 7 mg/dL (ref 6–20)
CALCIUM: 9 mg/dL (ref 8.9–10.3)
CHLORIDE: 102 mmol/L (ref 101–111)
CO2: 22 mmol/L (ref 22–32)
Creatinine, Ser: 0.62 mg/dL (ref 0.44–1.00)
GFR calc non Af Amer: >60 mL/min (ref >60)
Glucose, Bld: 89 mg/dL (ref 65–99)
POTASSIUM: 4.1 mmol/L (ref 3.5–5.1)
SODIUM: 133 mmol/L — AB (ref 135–145)
Total Bilirubin: 0.4 mg/dL (ref 0.3–1.2)
Total Protein: 6.8 g/dL (ref 6.5–8.1)

## 2016-09-19 LAB — PROTEIN / CREATININE RATIO, URINE
Creatinine, Urine: 109 mg/dL
Protein Creatinine Ratio: 0.1 mg/mg{Cre} (ref 0.00–0.15)
Total Protein, Urine: 11 mg/dL

## 2016-09-19 LAB — URINALYSIS, ROUTINE W REFLEX MICROSCOPIC
Bilirubin Urine: NEGATIVE
Glucose, UA: NEGATIVE mg/dL
Hgb urine dipstick: NEGATIVE
Ketones, ur: NEGATIVE mg/dL
Leukocytes, UA: NEGATIVE
Nitrite: NEGATIVE
Protein, ur: 30 mg/dL — AB
Specific Gravity, Urine: 1.024 (ref 1.005–1.030)
pH: 6 (ref 5.0–8.0)

## 2016-09-19 LAB — CBC
HCT: 31.9 % — ABNORMAL LOW (ref 36.0–46.0)
Hemoglobin: 10.7 g/dL — ABNORMAL LOW (ref 12.0–15.0)
MCH: 26.6 pg (ref 26.0–34.0)
MCHC: 33.5 g/dL (ref 30.0–36.0)
MCV: 79.2 fL (ref 78.0–100.0)
PLATELETS: 272 10*3/uL (ref 150–400)
RBC: 4.03 MIL/uL (ref 3.87–5.11)
RDW: 13.8 % (ref 11.5–15.5)
WBC: 10.4 10*3/uL (ref 4.0–10.5)

## 2016-09-19 MED ORDER — CYCLOBENZAPRINE HCL 10 MG PO TABS: 10.0000 mg | ORAL_TABLET | Freq: Three times a day (TID) | ORAL | 0 refills | Status: DC | PRN

## 2016-09-19 MED ORDER — ACETAMINOPHEN 500 MG PO TABS
1000.0000 mg | ORAL_TABLET | Freq: Once | ORAL | Status: AC
  Administered 2016-09-19: 1000 mg via ORAL
  Filled 2016-09-19: qty 2

## 2016-09-19 NOTE — MAU Provider Note (Signed)
History     CSN: [id number]  Arrival date and time: 09/18/16 2320   First Provider Initiated Contact with Patient 09/19/16 0014      Chief Complaint  Patient presents with  . Contractions  . Back Pain  . Hypertension   25 y/o [id number] at 36+0 weeks presenting for elevated blood pressure. She reports a reading on home machine of 120/113. Her first reading here was 151/91. She has since had readings of 126/76 and 120/83. She reports two previous visits to MAU for elevated pressures and complains that "nobody has done anything about it." She reports elevated BP, swelling, dizziness, and headache for months, but it is documented at her last visit on 12/15, she denied dizziness and Ha. No RUQ pain Denies vaginal bleeding, LOF. Reports some contractions and good fetal movement.     OB History    Gravida Para Term Preterm AB Living   4 1 1   2 1    SAB TAB Ectopic Multiple Live Births     2     1      Obstetric Comments   EAB x 2 (one in clinic procedure and one medical). Scheduled c-section b/c they said her pelvis was too long and narrow. Pt states she never went into labor.       Past Medical History:  Diagnosis Date  . Anxiety   . Asthma   . Headache   . History of chlamydia   . Infection    UTI  . Pregnancy induced hypertension    post partem    Past Surgical History:  Procedure Laterality Date  . CESAREAN SECTION      Family History  Problem Relation Age of Onset  . Hypertension Mother   . Cancer Maternal Grandmother   . Drug abuse Father     died from overdose  . Stroke Neg Hx     Social History  Substance Use Topics  . Smoking status: Never Smoker  . Smokeless tobacco: Never Used  . Alcohol use Yes     Comment: not while preg    Allergies: No Known Allergies  Prescriptions Prior to Admission  Medication Sig Dispense Refill Last Dose  . albuterol (PROVENTIL HFA;VENTOLIN HFA) 108 (90 BASE) MCG/ACT inhaler Inhale 2 puffs into the lungs every 6 (six)  hours as needed for wheezing or shortness of breath. 1 Inhaler 2 Past Month at Unknown time  . calcium carbonate (TUMS - DOSED IN MG ELEMENTAL CALCIUM) 500 MG chewable tablet Chew 2 tablets by mouth daily.   Past Week at Unknown time  . Pediatric Multiple Vit-C-FA (FLINSTONES GUMMIES OMEGA-3 DHA PO) Take by mouth.   09/18/2016 at Unknown time    Review of Systems  Eyes: Positive for blurred vision.  Neurological: Positive for dizziness and headaches. Negative for focal weakness and seizures.   Physical Exam   Blood pressure 124/73, pulse 101, temperature 98.5 F (36.9 C), resp. rate 18, height 5\' 1"  (1.549 m), weight 200 lb 6.4 oz (90.9 kg), last menstrual period 01/11/2016.  Physical Exam  Constitutional: She is oriented to person, place, and time. She appears well-developed and well-nourished. No distress.  Cardiovascular: Normal rate, regular rhythm and normal heart sounds.   Respiratory: Effort normal and breath sounds normal.  GI: Soft. She exhibits no distension. There is no tenderness.  Neurological: She is alert and oriented to person, place, and time.  EFM:130. Mod var. No decels  MAU Course  Procedures  MDM Monitor BPs- wnl after  initial elevated reading Pre-E labs wnl  Assessment and Plan  1. Pregnancy-induced hypertension in third trimester- Third MAU visit with elevated pressures. Labs within normal limits. Added diagnosis of gestational hypertension to problem list. Discharge home with instructions to take tylenol for headache.  Clearance Cootsndrew Tyson 09/19/2016, 12:57 AM   Midwife attestation:  I have seen and examined this patient; I agree with above documentation in the resident's note.   Kim [name] is a 25 y.o. [phone] reporting HA and elevated BO reading at home +FM, denies LOF, VB, contractions, vaginal discharge.  PE: Gen: calm comfortable, NAD Resp: normal effort, no distress Abd: gravid  ROS, labs, PMH reviewed NST reactive  HA improved after  Tylenol  A/P: [redacted] weeks gestation Gestational HTN Reactive NST Discharge home [facility] Pre-e precautions  Donette LarryMelanie Hermenegildo Clausen, CNM  4:40 AM

## 2016-09-19 NOTE — Progress Notes (Signed)
Dr Chales Abrahamsyson notified of sve, pain scale of "8" for h/a and "10" for cramping. Pt stable for d/c home and may use Tylenol at home as needed.

## 2016-09-19 NOTE — Progress Notes (Signed)
Written and verbal d/c instructions given and understanding voiced. 

## 2016-09-19 NOTE — Progress Notes (Signed)
efm d/ced 

## 2016-09-19 NOTE — MAU Note (Signed)
Pt called out and states is cramping some. Now states h/a is no better and "pain is the same"

## 2016-09-19 NOTE — Progress Notes (Signed)
Dr Chales Abrahamsyson notified of pt's c/o of cramping. Aware of irreg ctxs. Ok for RN to ck pt

## 2016-09-19 NOTE — Discharge Instructions (Signed)
Hypertension During Pregnancy Hypertension is also called high blood pressure. High blood pressure means that the force of your blood moving in your body is too strong. When you are pregnant, this condition should be watched carefully. It can cause problems for you and your baby. Follow these instructions at home: Eating and drinking  Drink enough fluid to keep your pee (urine) clear or pale yellow.  Eat healthy foods that are low in salt (sodium). ? Do not add salt to your food. ? Check labels on foods and drinks to see much salt is in them. Look on the label where you see "Sodium." Lifestyle  Do not use any products that contain nicotine or tobacco, such as cigarettes and e-cigarettes. If you need help quitting, ask your doctor.  Do not use alcohol.  Avoid caffeine.  Avoid stress. Rest and get plenty of sleep. General instructions  Take over-the-counter and prescription medicines only as told by your doctor.  While lying down, lie on your left side. This keeps pressure off your baby.  While sitting or lying down, raise (elevate) your feet. Try putting some pillows under your lower legs.  Exercise regularly. Ask your doctor what kinds of exercise are best for you.  Keep all prenatal and follow-up visits as told by your doctor. This is important. Contact a doctor if:  You have symptoms that your doctor told you to watch for, such as: ? Fever. ? Throwing up (vomiting). ? Headache. Get help right away if:  You have very bad pain in your belly (abdomen).  You are throwing up, and this does not get better with treatment.  You suddenly get swelling in your hands, ankles, or face.  You gain 4 lb (1.8 kg) or more in 1 week.  You get bleeding from your vagina.  You have blood in your pee.  You do not feel your baby moving as much as normal.  You have a change in vision.  You have muscle twitching or sudden tightening (spasms).  You have trouble breathing.  Your lips  or fingernails turn blue. This information is not intended to replace advice given to you by your health care provider. Make sure you discuss any questions you have with your health care provider. Document Released: 10/16/2010 Document Revised: 05/25/2016 Document Reviewed: 05/25/2016 Elsevier Interactive Patient Education  2017 Elsevier Inc.  

## 2016-09-23 ENCOUNTER — Ambulatory Visit (INDEPENDENT_AMBULATORY_CARE_PROVIDER_SITE_OTHER): Payer: Medicaid Other | Admitting: Obstetrics & Gynecology

## 2016-09-23 VITALS — BP 130/78 | HR 87 | Wt 202.0 lb

## 2016-09-23 DIAGNOSIS — O139 Gestational [pregnancy-induced] hypertension without significant proteinuria, unspecified trimester: Secondary | ICD-10-CM

## 2016-09-23 DIAGNOSIS — Z3483 Encounter for supervision of other normal pregnancy, third trimester: Secondary | ICD-10-CM

## 2016-09-23 NOTE — Progress Notes (Signed)
   PRENATAL VISIT NOTE  Subjective:  Kim [name] is a 25 y.o. G4P1021 at 2442w4d being seen today for ongoing prenatal care.  She is currently monitored for the following issues for this high-risk pregnancy and has Supervision of normal pregnancy, antepartum; History of cesarean delivery; ASCUS with positive high risk HPV cervical; and Gestational hypertension on her problem list.  Patient reports no complaints.  Contractions: Irregular. Vag. Bleeding: None.  Movement: Present. Denies leaking of fluid.   The following portions of the patient's history were reviewed and updated as appropriate: allergies, current medications, past family history, past medical history, past social history, past surgical history and problem list. Problem list updated.  Objective:   Vitals:   09/23/16 0843  BP: 130/78  Pulse: 87  Weight: 202 lb (91.6 kg)    Fetal Status:     Movement: Present     General:  Alert, oriented and cooperative. Patient is in no acute distress.  Skin: Skin is warm and dry. No rash noted.   Cardiovascular: Normal heart rate noted  Respiratory: Normal respiratory effort, no problems with respiration noted  Abdomen: Soft, gravid, appropriate for gestational age. Pain/Pressure: Absent     Pelvic:  Cervical exam performed        Extremities: Normal range of motion.  Edema: Mild pitting, slight indentation  Mental Status: Normal mood and affect. Normal behavior. Normal judgment and thought content.   Assessment and Plan:  Pregnancy: G4P1021 at 3542w4d  1. Encounter for supervision of other normal pregnancy in third trimester  - Strep Gp B NAA - US MFM OB FOLLOW UP; Future  2. Pregnancy induced hypertension, antepartum BP intermittently elevated, MAU visit reviewed. US for growth, CS repeat is scheduled 39 weeks - US MFM OB FOLLOW UP; Future  Preterm labor symptoms and general obstetric precautions including but not limited to vaginal bleeding, contractions, leaking of fluid and  fetal movement were reviewed in detail with the patient. Please refer to After Visit Summary for other counseling recommendations.  Return in about 5 days (around 09/28/2016).   Adam PhenixJames G Talia Hoheisel, MD

## 2016-09-25 LAB — STREP GP B NAA: Strep Gp B NAA: NEGATIVE

## 2016-09-28 ENCOUNTER — Encounter: Payer: Medicaid Other | Admitting: Obstetrics and Gynecology

## 2016-09-29 ENCOUNTER — Encounter (HOSPITAL_COMMUNITY): Payer: Self-pay

## 2016-09-29 ENCOUNTER — Inpatient Hospital Stay (HOSPITAL_COMMUNITY)
Admission: AD | Admit: 2016-09-29 | Discharge: 2016-09-30 | Disposition: A | Discharge status: Home / Self Care | Payer: Medicaid Other | Source: Ambulatory Visit | Attending: Obstetrics & Gynecology | Admitting: Obstetrics & Gynecology

## 2016-09-29 ENCOUNTER — Other Ambulatory Visit: Payer: Self-pay | Admitting: Obstetrics & Gynecology

## 2016-09-29 ENCOUNTER — Telehealth (HOSPITAL_COMMUNITY): Payer: Self-pay | Admitting: *Deleted

## 2016-09-29 ENCOUNTER — Ambulatory Visit (HOSPITAL_COMMUNITY)
Admission: RE | Admit: 2016-09-29 | Discharge: 2016-09-29 | Disposition: A | Discharge status: Home / Self Care | Payer: Medicaid Other | Source: Ambulatory Visit | Attending: Obstetrics & Gynecology | Admitting: Obstetrics & Gynecology

## 2016-09-29 DIAGNOSIS — O26893 Other specified pregnancy related conditions, third trimester: Secondary | ICD-10-CM | POA: Diagnosis present

## 2016-09-29 DIAGNOSIS — O99213 Obesity complicating pregnancy, third trimester: Secondary | ICD-10-CM | POA: Diagnosis present

## 2016-09-29 DIAGNOSIS — O163 Unspecified maternal hypertension, third trimester: Secondary | ICD-10-CM | POA: Insufficient documentation

## 2016-09-29 DIAGNOSIS — Z363 Encounter for antenatal screening for malformations: Secondary | ICD-10-CM | POA: Diagnosis not present

## 2016-09-29 DIAGNOSIS — Z3A37 37 weeks gestation of pregnancy: Secondary | ICD-10-CM

## 2016-09-29 DIAGNOSIS — R519 Headache, unspecified: Secondary | ICD-10-CM

## 2016-09-29 DIAGNOSIS — Z3483 Encounter for supervision of other normal pregnancy, third trimester: Secondary | ICD-10-CM

## 2016-09-29 DIAGNOSIS — O9989 Other specified diseases and conditions complicating pregnancy, childbirth and the puerperium: Secondary | ICD-10-CM | POA: Diagnosis not present

## 2016-09-29 DIAGNOSIS — O133 Gestational [pregnancy-induced] hypertension without significant proteinuria, third trimester: Secondary | ICD-10-CM | POA: Insufficient documentation

## 2016-09-29 DIAGNOSIS — R51 Headache: Secondary | ICD-10-CM | POA: Insufficient documentation

## 2016-09-29 DIAGNOSIS — O139 Gestational [pregnancy-induced] hypertension without significant proteinuria, unspecified trimester: Secondary | ICD-10-CM

## 2016-09-29 LAB — URINALYSIS, ROUTINE W REFLEX MICROSCOPIC
BILIRUBIN URINE: NEGATIVE
Glucose, UA: NEGATIVE mg/dL
HGB URINE DIPSTICK: NEGATIVE
Ketones, ur: 5 mg/dL — AB
Leukocytes, UA: NEGATIVE
NITRITE: NEGATIVE
PROTEIN: NEGATIVE mg/dL
Specific Gravity, Urine: 1.016 (ref 1.005–1.030)
pH: 7 (ref 5.0–8.0)

## 2016-09-29 LAB — CBC WITH DIFFERENTIAL/PLATELET
Basophils Absolute: 0 10*3/uL (ref 0.0–0.1)
Basophils Relative: 0 %
Eosinophils Absolute: 0.1 10*3/uL (ref 0.0–0.7)
Eosinophils Relative: 1 %
HEMATOCRIT: 33 % — AB (ref 36.0–46.0)
HEMOGLOBIN: 11 g/dL — AB (ref 12.0–15.0)
LYMPHS ABS: 2.1 10*3/uL (ref 0.7–4.0)
Lymphocytes Relative: 22 %
MCH: 26.4 pg (ref 26.0–34.0)
MCHC: 33.3 g/dL (ref 30.0–36.0)
MCV: 79.3 fL (ref 78.0–100.0)
MONOS PCT: 9 %
Monocytes Absolute: 0.9 10*3/uL (ref 0.1–1.0)
NEUTROS ABS: 6.2 10*3/uL (ref 1.7–7.7)
NEUTROS PCT: 68 %
Platelets: 301 10*3/uL (ref 150–400)
RBC: 4.16 MIL/uL (ref 3.87–5.11)
RDW: 14.3 % (ref 11.5–15.5)
WBC: 9.2 10*3/uL (ref 4.0–10.5)

## 2016-09-29 LAB — COMPREHENSIVE METABOLIC PANEL
ALBUMIN: 3.3 g/dL — AB (ref 3.5–5.0)
ALT: 16 U/L (ref 14–54)
ANION GAP: 10 (ref 5–15)
AST: 21 U/L (ref 15–41)
Alkaline Phosphatase: 109 U/L (ref 38–126)
BUN: 7 mg/dL (ref 6–20)
CHLORIDE: 103 mmol/L (ref 101–111)
CO2: 20 mmol/L — AB (ref 22–32)
Calcium: 8.9 mg/dL (ref 8.9–10.3)
Creatinine, Ser: 0.6 mg/dL (ref 0.44–1.00)
GFR calc Af Amer: >60 mL/min (ref >60)
GFR calc non Af Amer: >60 mL/min (ref >60)
GLUCOSE: 98 mg/dL (ref 65–99)
Potassium: 3.9 mmol/L (ref 3.5–5.1)
SODIUM: 133 mmol/L — AB (ref 135–145)
Total Bilirubin: 0.2 mg/dL — ABNORMAL LOW (ref 0.3–1.2)
Total Protein: 7.2 g/dL (ref 6.5–8.1)

## 2016-09-29 MED ORDER — SODIUM CHLORIDE 0.9 % IV BOLUS (SEPSIS): 1000.0000 mL | Freq: Once | INTRAVENOUS | Status: DC

## 2016-09-29 MED ORDER — ACETAMINOPHEN 325 MG PO TABS
650.0000 mg | ORAL_TABLET | Freq: Once | ORAL | Status: AC
  Administered 2016-09-29: 650 mg via ORAL
  Filled 2016-09-29: qty 2

## 2016-09-29 NOTE — Telephone Encounter (Signed)
Preadmission screen  

## 2016-09-29 NOTE — MAU Note (Signed)
Patient was seen today for growth U/S.  Afterwards went to grocery store and felt faint.  States she has gest. HTN and that BP was 150s/90s today.  Complains of headache 6/10.  Has not taken anything.  Denies any vision changes, Cp, SOB, etc.  Denies CTX, vaginal bleeding, or LOF.  Positive fetal movement.  Scheduled for repeat C/S on January 14th.

## 2016-09-29 NOTE — H&P (Signed)
MAU HISTORY AND PHYSICAL  Chief Complaint:  Hypertension  Kim [name] is a 26 y.o.  [id number] at 8759w3d presenting for hypertension. She was seen today in clinic for a growth ultrasound and says that she felt faint later in the day while at the grocery store around 7 pm. At her ultrasound appointment, she reports that her BP was elevated to 150s/90s. She did not pass out and never had an episode like this before. She had eaten dinner before going to the store.   She denies chest pain and shortness of breath as well as personal or family history of cardiac disease. Patient denies CTX but has some irritability, vaginal bleeding, and leakage of fluid. She also endorses some periodic sharp pain at the umbilicus and reports good fetal movement.  Patient endorses a frontotemporal headache both before and during pregnancy. Her headaches have become more frequent for the past few weeks (starting in November) and now are happening once a day, usually at night. She has been seeing spots for the past week that look like little white dots that come and go and had visual changes with her prior pregnancy as well, especially during the past week. She feels flushed and hot but denies fevers and rash. She had diarrhea once yesterday and almost threw up this morning; she denies dysuria. She had some edema in her legs that resolved. She has felt fatigued for about the past 3 days.   Past Medical History:  Diagnosis Date  . Anxiety   . Asthma   . Headache   . History of chlamydia   . Infection    UTI  . Pregnancy induced hypertension     Past Surgical History:  Procedure Laterality Date  . CESAREAN SECTION      Family History  Problem Relation Age of Onset  . Hypertension Mother   . Cancer Maternal Grandmother   . Drug abuse Father     died from overdose  . Stroke Neg Hx     Social History  Substance Use Topics  . Smoking status: Never Smoker  . Smokeless tobacco: Never Used  . Alcohol use Yes   Comment: not while preg    No Known Allergies  Prescriptions Prior to Admission  Medication Sig Dispense Refill Last Dose  . calcium carbonate (TUMS - DOSED IN MG ELEMENTAL CALCIUM) 500 MG chewable tablet Chew 2 tablets by mouth daily.   09/28/2016 at Unknown time  . Pediatric Multiple Vit-C-FA (FLINSTONES GUMMIES OMEGA-3 DHA PO) Take 2 each by mouth every morning.    09/29/2016 at Unknown time  . albuterol (PROVENTIL HFA;VENTOLIN HFA) 108 (90 BASE) MCG/ACT inhaler Inhale 2 puffs into the lungs every 6 (six) hours as needed for wheezing or shortness of breath. 1 Inhaler 2 rescue    Review of Systems - Negative except for what is mentioned in HPI.  Physical Exam  Blood pressure 134/76, pulse 96, temperature 97.8 F (36.6 C), temperature source Oral, resp. rate 18, height 5\' 1"  (1.549 m), weight 91.2 kg (201 lb), last menstrual period 01/11/2016. GENERAL: Well-developed, well-nourished female in no acute distress  HEENT: frontotemporal region and face non-tender to palpation; moist mucous membranes LUNGS: Clear to auscultation bilaterally; normal work of breathing on room air; no wheezes or crackles  HEART: tachycardic with regular rhythm ABDOMEN: Soft, nontender, nondistended, normal bowel sounds, no rebound or guardin.  NEURO: CN 2-12 intact, 5/5 strength in all 4 extremities, normal dorsiflexion and plantarflexion of feet, no clonus in BLEs PELVIC/GU: deferred  EXTREMITIES: Nontender, no edema, 2+ distal pulses FHT: baseline rate 145 bmp, 6-25 bpm variability, 15x15 accels, no decels Contractions: none   Labs: Results for orders placed or performed during the hospital encounter of 09/29/16 (from the past 24 hour(s))  Urinalysis, Routine w reflex microscopic   Collection Time: 09/29/16  9:25 PM  Result Value Ref Range   Color, Urine YELLOW YELLOW   APPearance CLOUDY (A) CLEAR   Specific Gravity, Urine 1.016 1.005 - 1.030   pH 7.0 5.0 - 8.0   Glucose, UA NEGATIVE NEGATIVE mg/dL   Hgb  urine dipstick NEGATIVE NEGATIVE   Bilirubin Urine NEGATIVE NEGATIVE   Ketones, ur 5 (A) NEGATIVE mg/dL   Protein, ur NEGATIVE NEGATIVE mg/dL   Nitrite NEGATIVE NEGATIVE   Leukocytes, UA NEGATIVE NEGATIVE  Comprehensive metabolic panel   Collection Time: 09/29/16 10:51 PM  Result Value Ref Range   Sodium 133 (L) 135 - 145 mmol/L   Potassium 3.9 3.5 - 5.1 mmol/L   Chloride 103 101 - 111 mmol/L   CO2 20 (L) 22 - 32 mmol/L   Glucose, Bld 98 65 - 99 mg/dL   BUN 7 6 - 20 mg/dL   Creatinine, Ser 4.09 0.44 - 1.00 mg/dL   Calcium 8.9 8.9 - 81.1 mg/dL   Total Protein 7.2 6.5 - 8.1 g/dL   Albumin 3.3 (L) 3.5 - 5.0 g/dL   AST 21 15 - 41 U/L   ALT 16 14 - 54 U/L   Alkaline Phosphatase 109 38 - 126 U/L   Total Bilirubin 0.2 (L) 0.3 - 1.2 mg/dL   GFR calc non Af Amer >60 >60 mL/min   GFR calc Af Amer >60 >60 mL/min   Anion gap 10 5 - 15  CBC with Differential/Platelet   Collection Time: 09/29/16 10:51 PM  Result Value Ref Range   WBC 9.2 4.0 - 10.5 K/uL   RBC 4.16 3.87 - 5.11 MIL/uL   Hemoglobin 11.0 (L) 12.0 - 15.0 g/dL   HCT 91.4 (L) 78.2 - 95.6 %   MCV 79.3 78.0 - 100.0 fL   MCH 26.4 26.0 - 34.0 pg   MCHC 33.3 30.0 - 36.0 g/dL   RDW 21.3 08.6 - 57.8 %   Platelets 301 150 - 400 K/uL   Neutrophils Relative % 68 %   Neutro Abs 6.2 1.7 - 7.7 K/uL   Lymphocytes Relative 22 %   Lymphs Abs 2.1 0.7 - 4.0 K/uL   Monocytes Relative 9 %   Monocytes Absolute 0.9 0.1 - 1.0 K/uL   Eosinophils Relative 1 %   Eosinophils Absolute 0.1 0.0 - 0.7 K/uL   Basophils Relative 0 %   Basophils Absolute 0.0 0.0 - 0.1 K/uL    Imaging Studies:  [place] Mfm Ob Detail +14 Wk  Result Date: 09/30/2016 OBSTETRICAL ULTRASOUND: This exam was performed within a [facility]. The OB US report was generated in the AS system, and faxed to the ordering physician.  This report is available in the [employer]. See the AS Obstetric US report via the Image Link.   Assessment: Kim [name] is  26  y.o. [phone] at [redacted]w[redacted]d presents with previously-diagnosed gestational hypertension and chronic headache, and occasional visual disturbance with one episode of dizziness earlier this evening most likely due to ongoing tension headache and chronic HTN with possible contribution from dehydration given ketonuria and mild tachycardia since presentation. Preeclampsia is unlikely given chronic nature of her headaches, absence of protein on urinalysis, and reassuring blood  pressures since presentation (with a max BP of 142 systolic). CBC and CMP are also reassuring.  Plan: Plan for discharge home with Tylenol for persistent headache; encouraged adequate PO intake given possible mild dehydration. Provided information regarding return precautions for signs/symptoms of labor; plan for patient to follow up in clinic.  Ennis Forts, medical student  [facility] 09/29/2016  Pt was not admitted- see my MAU note. I have seen and examined this patient and I agree with the above. Cam Hai 9:39 AM 09/30/2016

## 2016-09-30 DIAGNOSIS — O9989 Other specified diseases and conditions complicating pregnancy, childbirth and the puerperium: Secondary | ICD-10-CM

## 2016-09-30 DIAGNOSIS — R51 Headache: Secondary | ICD-10-CM

## 2016-09-30 NOTE — MAU Provider Note (Signed)
MAU HISTORY AND PHYSICAL  Chief Complaint:  Hypertension  Kim [name] is a 26 y.o.  [id number] at 5746w3d presenting for hypertension. She was seen today in clinic for a growth ultrasound and says that she felt faint later in the day while at the grocery store around 7 pm. At her ultrasound appointment, she reports that her BP was elevated to 150s/90s. She did not pass out and never had an episode like this before. She had eaten dinner before going to the store.   She denies chest pain and shortness of breath as well as personal or family history of cardiac disease. Patient denies CTX but has some irritability, vaginal bleeding, and leakage of fluid. She also endorses some periodic sharp pain at the umbilicus and reports good fetal movement.  Patient endorses a frontotemporal headache both before and during pregnancy. Her headaches have become more frequent for the past few weeks (starting in November) and now are happening once a day, usually at night. She has been seeing spots for the past week that look like little white dots that come and go and had visual changes with her prior pregnancy as well, especially during the past week. She feels flushed and hot but denies fevers and rash. She had diarrhea once yesterday and almost threw up this morning; she denies dysuria. She had some edema in her legs that resolved. She has felt fatigued for about the past 3 days.       Past Medical History:  Diagnosis Date  . Anxiety   . Asthma   . Headache   . History of chlamydia   . Infection    UTI  . Pregnancy induced hypertension          Past Surgical History:  Procedure Laterality Date  . CESAREAN SECTION            Family History  Problem Relation Age of Onset  . Hypertension Mother   . Cancer Maternal Grandmother   . Drug abuse Father     died from overdose  . Stroke Neg Hx            Social History   Substance Use Topics   . Smoking status: Never Smoker   .  Smokeless tobacco: Never Used   . Alcohol use Yes     Comment: not while preg     No Known Allergies         Prescriptions Prior to Admission  Medication Sig Dispense Refill Last Dose  . calcium carbonate (TUMS - DOSED IN MG ELEMENTAL CALCIUM) 500 MG chewable tablet Chew 2 tablets by mouth daily.   09/28/2016 at Unknown time  . Pediatric Multiple Vit-C-FA (FLINSTONES GUMMIES OMEGA-3 DHA PO) Take 2 each by mouth every morning.    09/29/2016 at Unknown time  . albuterol (PROVENTIL HFA;VENTOLIN HFA) 108 (90 BASE) MCG/ACT inhaler Inhale 2 puffs into the lungs every 6 (six) hours as needed for wheezing or shortness of breath. 1 Inhaler 2 rescue    Review of Systems - Negative except for what is mentioned in HPI.  Physical Exam  Blood pressure 134/76, pulse 96, temperature 97.8 F (36.6 C), temperature source Oral, resp. rate 18, height 5\' 1"  (1.549 m), weight 91.2 kg (201 lb), last menstrual period 01/11/2016. GENERAL: Well-developed, well-nourished female in no acute distress  HEENT: frontotemporal region and face non-tender to palpation; moist mucous membranes LUNGS: Clear to auscultation bilaterally; normal work of breathing on room air; no wheezes or crackles  HEART: tachycardic  with regular rhythm ABDOMEN: Soft, nontender, nondistended, normal bowel sounds, no rebound or guardin.  NEURO: CN 2-12 intact, 5/5 strength in all 4 extremities, normal dorsiflexion and plantarflexion of feet, no clonus in BLEs PELVIC/GU: deferred EXTREMITIES: Nontender, no edema, 2+ distal pulses FHT: baseline rate 145 bmp, 6-25 bpm variability, 15x15 accels, no decels Contractions: none   Labs:      Results for orders placed or performed during the hospital encounter of 09/29/16 (from the past 24 hour(s))  Urinalysis, Routine w reflex microscopic   Collection Time: 09/29/16  9:25 PM  Result Value Ref Range   Color, Urine YELLOW YELLOW   APPearance CLOUDY (A) CLEAR   Specific Gravity,  Urine 1.016 1.005 - 1.030   pH 7.0 5.0 - 8.0   Glucose, UA NEGATIVE NEGATIVE mg/dL   Hgb urine dipstick NEGATIVE NEGATIVE   Bilirubin Urine NEGATIVE NEGATIVE   Ketones, ur 5 (A) NEGATIVE mg/dL   Protein, ur NEGATIVE NEGATIVE mg/dL   Nitrite NEGATIVE NEGATIVE   Leukocytes, UA NEGATIVE NEGATIVE  Comprehensive metabolic panel   Collection Time: 09/29/16 10:51 PM  Result Value Ref Range   Sodium 133 (L) 135 - 145 mmol/L   Potassium 3.9 3.5 - 5.1 mmol/L   Chloride 103 101 - 111 mmol/L   CO2 20 (L) 22 - 32 mmol/L   Glucose, Bld 98 65 - 99 mg/dL   BUN 7 6 - 20 mg/dL   Creatinine, Ser 1.61 0.44 - 1.00 mg/dL   Calcium 8.9 8.9 - 09.6 mg/dL   Total Protein 7.2 6.5 - 8.1 g/dL   Albumin 3.3 (L) 3.5 - 5.0 g/dL   AST 21 15 - 41 U/L   ALT 16 14 - 54 U/L   Alkaline Phosphatase 109 38 - 126 U/L   Total Bilirubin 0.2 (L) 0.3 - 1.2 mg/dL   GFR calc non Af Amer >60 >60 mL/min   GFR calc Af Amer >60 >60 mL/min   Anion gap 10 5 - 15  CBC with Differential/Platelet   Collection Time: 09/29/16 10:51 PM  Result Value Ref Range   WBC 9.2 4.0 - 10.5 K/uL   RBC 4.16 3.87 - 5.11 MIL/uL   Hemoglobin 11.0 (L) 12.0 - 15.0 g/dL   HCT 04.5 (L) 40.9 - 81.1 %   MCV 79.3 78.0 - 100.0 fL   MCH 26.4 26.0 - 34.0 pg   MCHC 33.3 30.0 - 36.0 g/dL   RDW 91.4 78.2 - 95.6 %   Platelets 301 150 - 400 K/uL   Neutrophils Relative % 68 %   Neutro Abs 6.2 1.7 - 7.7 K/uL   Lymphocytes Relative 22 %   Lymphs Abs 2.1 0.7 - 4.0 K/uL   Monocytes Relative 9 %   Monocytes Absolute 0.9 0.1 - 1.0 K/uL   Eosinophils Relative 1 %   Eosinophils Absolute 0.1 0.0 - 0.7 K/uL   Basophils Relative 0 %   Basophils Absolute 0.0 0.0 - 0.1 K/uL    Imaging Studies:   Imaging Results  [place] Mfm Ob Detail +14 Wk  Result Date: 09/30/2016 OBSTETRICAL ULTRASOUND: This exam was performed within a [facility]. The OB US report was generated in the AS system, and faxed to  the ordering physician.  This report is available in the [employer]. See the [facility] Obstetric US report via the Image Link.    Assessment: Kim [name] is  26 y.o. [phone] at [redacted]w[redacted]d presents with previously-diagnosed gestational hypertension and chronic headache, and occasional visual disturbance  with one episode of dizziness earlier this evening most likely due to ongoing tension headache and chronic HTN with possible contribution from dehydration given ketonuria and mild tachycardia since presentation. Preeclampsia is unlikely given chronic nature of her headaches, absence of protein on urinalysis, and reassuring blood pressures since presentation (with a max BP of 142 systolic). CBC and CMP are also reassuring.  Plan: Plan for discharge home with Tylenol for persistent headache; encouraged adequate PO intake given possible mild dehydration. Provided information regarding return precautions for signs/symptoms of labor; plan for patient to follow up in clinic.  Ennis Forts, medical student  [facility] 09/29/2016  MAU attestation:  Kim [name] is a 26 y.o. [phone] reporting feeling faint while shopping; elevated BP during U/S  +FM, denies LOF, VB, contractions, vaginal discharge.  PE: BP 130/68 (BP Location: Left Arm)   Pulse 95   Temp 97.8 F (36.6 C) (Oral)   Resp 18   Ht 5\' 1"  (1.549 m)   Wt 91.2 kg (201 lb)   LMP 01/11/2016   BMI 37.98 kg/m  Gen: calm comfortable, NAD Resp: normal effort, no distress Abd: gravid  ROS, labs, PMH reviewed NST reactive w/ rare ctx  Plan: - reassured re labwork and BPs- pt mostly desires to be finished with the pregnancy - continue routine follow up in OB clinic- she will need to call as I can't locate a next appt scheduled - C/S for 10/10/16  Cam Hai, CNM 9:37 AM

## 2016-09-30 NOTE — Discharge Instructions (Signed)
Hypertension During Pregnancy Hypertension is also called high blood pressure. High blood pressure means that the force of your blood moving in your body is too strong. When you are pregnant, this condition should be watched carefully. It can cause problems for you and your baby. Follow these instructions at home: Eating and drinking  Drink enough fluid to keep your pee (urine) clear or pale yellow.  Eat healthy foods that are low in salt (sodium). ? Do not add salt to your food. ? Check labels on foods and drinks to see much salt is in them. Look on the label where you see "Sodium." Lifestyle  Do not use any products that contain nicotine or tobacco, such as cigarettes and e-cigarettes. If you need help quitting, ask your doctor.  Do not use alcohol.  Avoid caffeine.  Avoid stress. Rest and get plenty of sleep. General instructions  Take over-the-counter and prescription medicines only as told by your doctor.  While lying down, lie on your left side. This keeps pressure off your baby.  While sitting or lying down, raise (elevate) your feet. Try putting some pillows under your lower legs.  Exercise regularly. Ask your doctor what kinds of exercise are best for you.  Keep all prenatal and follow-up visits as told by your doctor. This is important. Contact a doctor if:  You have symptoms that your doctor told you to watch for, such as: ? Fever. ? Throwing up (vomiting). ? Headache. Get help right away if:  You have very bad pain in your belly (abdomen).  You are throwing up, and this does not get better with treatment.  You suddenly get swelling in your hands, ankles, or face.  You gain 4 lb (1.8 kg) or more in 1 week.  You get bleeding from your vagina.  You have blood in your pee.  You do not feel your baby moving as much as normal.  You have a change in vision.  You have muscle twitching or sudden tightening (spasms).  You have trouble breathing.  Your lips  or fingernails turn blue. This information is not intended to replace advice given to you by your health care provider. Make sure you discuss any questions you have with your health care provider. Document Released: 10/16/2010 Document Revised: 05/25/2016 Document Reviewed: 05/25/2016 Elsevier Interactive Patient Education  2017 Elsevier Inc.  

## 2016-10-06 ENCOUNTER — Ambulatory Visit (INDEPENDENT_AMBULATORY_CARE_PROVIDER_SITE_OTHER): Payer: Medicaid Other | Admitting: Obstetrics

## 2016-10-06 ENCOUNTER — Encounter: Payer: Self-pay | Admitting: Obstetrics

## 2016-10-06 ENCOUNTER — Encounter: Payer: Self-pay | Admitting: *Deleted

## 2016-10-06 DIAGNOSIS — O133 Gestational [pregnancy-induced] hypertension without significant proteinuria, third trimester: Secondary | ICD-10-CM

## 2016-10-06 DIAGNOSIS — Z789 Other specified health status: Secondary | ICD-10-CM | POA: Insufficient documentation

## 2016-10-06 DIAGNOSIS — Z23 Encounter for immunization: Secondary | ICD-10-CM

## 2016-10-06 DIAGNOSIS — Z3483 Encounter for supervision of other normal pregnancy, third trimester: Secondary | ICD-10-CM

## 2016-10-06 DIAGNOSIS — Z3480 Encounter for supervision of other normal pregnancy, unspecified trimester: Secondary | ICD-10-CM

## 2016-10-06 NOTE — Progress Notes (Signed)
Patient is in the office, reports good fetal movement. 

## 2016-10-06 NOTE — Progress Notes (Signed)
Subjective:    Kim [name] is a 26 y.o. female being seen today for her obstetrical visit. She is at 1434w3d gestation. Patient reports no complaints. Fetal movement: normal.  Problem List Items Addressed This Visit    Multiple body piercings   Supervision of normal pregnancy, antepartum   Relevant Orders   Tdap vaccine greater than or equal to 27yo IM (Completed)     Patient Active Problem List   Diagnosis Date Noted  . Multiple body piercings 10/06/2016  . Gestational hypertension 09/19/2016  . ASCUS with positive high risk HPV cervical 04/23/2016  . Supervision of normal pregnancy, antepartum 04/15/2016  . History of cesarean delivery 04/15/2016    Objective:    BP 111/71   Pulse 94   Temp 98.8 F (37.1 C)   Wt 203 lb (92.1 kg)   LMP 01/11/2016   BMI 38.36 kg/m  FHT: 150 BPM  Uterine Size: size equals dates  Presentations: cephalic    Assessment:    Pregnancy @ 534w3d weeks   Plan:   Plans for delivery: C/Section scheduled; labs reviewed; problem list updated Counseling: Consent signed. Infant feeding: plans to breastfeed. Cigarette smoking: never smoked. L&D discussion: symptoms of labor, discussed when to call, discussed what number to call, anesthetic/analgesic options reviewed and delivering clinician:  plans Physician. Postpartum supports and preparation: circumcision discussed and contraception plans discussed.  Follow up  postpartum.  Patient ID: Kim [name], female   DOB: 05/14/1991, 26 y.o.   MRN: [id number]

## 2016-10-06 NOTE — Patient Instructions (Signed)
19 Kim [name]  10/06/2016   Your procedure is scheduled on:  10/10/2016  Enter through the [facility] at 0630 AM.     Call this number if you have problems the morning of surgery: [phone]   Remember:   Do not eat food:After Midnight.  Do not drink clear liquids: After Midnight.  Take these medicines the morning of surgery with A SIP OF WATER: Please bring your inhaler with you.   Do not wear jewelry, make-up or nail polish.  Do not wear lotions, powders, or perfumes. Do not wear deodorant.  Do not shave 48 hours prior to surgery.  Do not bring valuables to the hospital.  [facility] is not   responsible for any belongings or valuables brought to the hospital.  Contacts, dentures or bridgework may not be worn into surgery.  Leave suitcase in the car. After surgery it may be brought to your room.  For patients admitted to the hospital, checkout time is 11:00 AM the day of              discharge.   Patients discharged the day of surgery will not be allowed to drive             home.  Name and phone number of your driver: na  Special Instructions:   N/A   Please read over the following fact sheets that you were given:   Surgical Site Infection Prevention

## 2016-10-08 ENCOUNTER — Encounter (HOSPITAL_COMMUNITY)
Admission: RE | Admit: 2016-10-08 | Discharge: 2016-10-08 | Disposition: A | Discharge status: Home / Self Care | Payer: Medicaid Other | Source: Ambulatory Visit

## 2016-10-08 LAB — CBC
HCT: 33.5 % — ABNORMAL LOW (ref 36.0–46.0)
HEMOGLOBIN: 11.1 g/dL — AB (ref 12.0–15.0)
MCH: 26.4 pg (ref 26.0–34.0)
MCHC: 33.1 g/dL (ref 30.0–36.0)
MCV: 79.8 fL (ref 78.0–100.0)
PLATELETS: 298 10*3/uL (ref 150–400)
RBC: 4.2 MIL/uL (ref 3.87–5.11)
RDW: 14.5 % (ref 11.5–15.5)
WBC: 8.3 10*3/uL (ref 4.0–10.5)

## 2016-10-08 LAB — BASIC METABOLIC PANEL
Anion gap: 6 (ref 5–15)
BUN: 5 mg/dL — AB (ref 6–20)
CALCIUM: 9.3 mg/dL (ref 8.9–10.3)
CO2: 24 mmol/L (ref 22–32)
CREATININE: 0.57 mg/dL (ref 0.44–1.00)
Chloride: 104 mmol/L (ref 101–111)
GFR calc Af Amer: >60 mL/min (ref >60)
GFR calc non Af Amer: >60 mL/min (ref >60)
GLUCOSE: 89 mg/dL (ref 65–99)
Potassium: 4.4 mmol/L (ref 3.5–5.1)
Sodium: 134 mmol/L — ABNORMAL LOW (ref 135–145)

## 2016-10-08 LAB — ABO/RH: ABO/RH(D): O POS

## 2016-10-09 LAB — RPR: RPR Ser Ql: NONREACTIVE

## 2016-10-10 ENCOUNTER — Inpatient Hospital Stay (HOSPITAL_COMMUNITY)
Admission: AD | Admit: 2016-10-10 | Discharge: 2016-10-13 | DRG: 766 | Disposition: A | Discharge status: Home / Self Care | Payer: Medicaid Other | Source: Ambulatory Visit | Attending: Obstetrics & Gynecology | Admitting: Obstetrics & Gynecology

## 2016-10-10 ENCOUNTER — Encounter (HOSPITAL_COMMUNITY)
Admission: AD | Disposition: A | Discharge status: Home / Self Care | Payer: Self-pay | Source: Ambulatory Visit | Attending: Obstetrics & Gynecology

## 2016-10-10 ENCOUNTER — Other Ambulatory Visit: Payer: Self-pay | Admitting: Obstetrics and Gynecology

## 2016-10-10 ENCOUNTER — Inpatient Hospital Stay (HOSPITAL_COMMUNITY): Payer: Medicaid Other | Admitting: Anesthesiology

## 2016-10-10 ENCOUNTER — Encounter (HOSPITAL_COMMUNITY): Payer: Self-pay

## 2016-10-10 ENCOUNTER — Encounter (HOSPITAL_COMMUNITY): Payer: Self-pay | Admitting: General Practice

## 2016-10-10 ENCOUNTER — Inpatient Hospital Stay (HOSPITAL_COMMUNITY)
Admission: RE | Admit: 2016-10-10 | Payer: Medicaid Other | Source: Ambulatory Visit | Admitting: Obstetrics & Gynecology

## 2016-10-10 DIAGNOSIS — J45909 Unspecified asthma, uncomplicated: Secondary | ICD-10-CM | POA: Diagnosis present

## 2016-10-10 DIAGNOSIS — O139 Gestational [pregnancy-induced] hypertension without significant proteinuria, unspecified trimester: Secondary | ICD-10-CM

## 2016-10-10 DIAGNOSIS — O134 Gestational [pregnancy-induced] hypertension without significant proteinuria, complicating childbirth: Secondary | ICD-10-CM | POA: Diagnosis present

## 2016-10-10 DIAGNOSIS — O34211 Maternal care for low transverse scar from previous cesarean delivery: Secondary | ICD-10-CM | POA: Diagnosis present

## 2016-10-10 DIAGNOSIS — Z8249 Family history of ischemic heart disease and other diseases of the circulatory system: Secondary | ICD-10-CM | POA: Diagnosis not present

## 2016-10-10 DIAGNOSIS — Z3A39 39 weeks gestation of pregnancy: Secondary | ICD-10-CM

## 2016-10-10 DIAGNOSIS — Z98891 History of uterine scar from previous surgery: Secondary | ICD-10-CM

## 2016-10-10 DIAGNOSIS — O9952 Diseases of the respiratory system complicating childbirth: Secondary | ICD-10-CM | POA: Diagnosis present

## 2016-10-10 LAB — CBC
HEMATOCRIT: 35.4 % — AB (ref 36.0–46.0)
HEMOGLOBIN: 11.7 g/dL — AB (ref 12.0–15.0)
MCH: 26.3 pg (ref 26.0–34.0)
MCHC: 33.1 g/dL (ref 30.0–36.0)
MCV: 79.6 fL (ref 78.0–100.0)
Platelets: 310 10*3/uL (ref 150–400)
RBC: 4.45 MIL/uL (ref 3.87–5.11)
RDW: 14.5 % (ref 11.5–15.5)
WBC: 10 10*3/uL (ref 4.0–10.5)

## 2016-10-10 LAB — COMPREHENSIVE METABOLIC PANEL
ALK PHOS: 134 U/L — AB (ref 38–126)
ALT: 14 U/L (ref 14–54)
ANION GAP: 9 (ref 5–15)
AST: 21 U/L (ref 15–41)
Albumin: 3.4 g/dL — ABNORMAL LOW (ref 3.5–5.0)
BUN: 7 mg/dL (ref 6–20)
CALCIUM: 8.9 mg/dL (ref 8.9–10.3)
CO2: 21 mmol/L — AB (ref 22–32)
Chloride: 103 mmol/L (ref 101–111)
Creatinine, Ser: 0.55 mg/dL (ref 0.44–1.00)
GFR calc Af Amer: >60 mL/min (ref >60)
GFR calc non Af Amer: >60 mL/min (ref >60)
Glucose, Bld: 85 mg/dL (ref 65–99)
POTASSIUM: 4.2 mmol/L (ref 3.5–5.1)
SODIUM: 133 mmol/L — AB (ref 135–145)
Total Bilirubin: 0.5 mg/dL (ref 0.3–1.2)
Total Protein: 7.3 g/dL (ref 6.5–8.1)

## 2016-10-10 LAB — PREPARE RBC (CROSSMATCH)

## 2016-10-10 SURGERY — Surgical Case
Anesthesia: Spinal | Site: Abdomen | Wound class: Clean Contaminated

## 2016-10-10 MED ORDER — WITCH HAZEL-GLYCERIN EX PADS: 1.0000 "application " | MEDICATED_PAD | CUTANEOUS | Status: DC | PRN

## 2016-10-10 MED ORDER — KETOROLAC TROMETHAMINE 30 MG/ML IJ SOLN: 30.0000 mg | Freq: Four times a day (QID) | INTRAMUSCULAR | Status: AC | PRN

## 2016-10-10 MED ORDER — NALOXONE HCL 2 MG/2ML IJ SOSY
1.0000 ug/kg/h | PREFILLED_SYRINGE | INTRAVENOUS | Status: DC | PRN
  Filled 2016-10-10: qty 2

## 2016-10-10 MED ORDER — OXYCODONE HCL 5 MG PO TABS
5.0000 mg | ORAL_TABLET | ORAL | Status: DC | PRN
  Administered 2016-10-10 – 2016-10-12 (×5): 5 mg via ORAL
  Filled 2016-10-10 (×6): qty 1

## 2016-10-10 MED ORDER — SODIUM CHLORIDE 0.9% FLUSH: 3.0000 mL | INTRAVENOUS | Status: DC | PRN

## 2016-10-10 MED ORDER — NALBUPHINE HCL 10 MG/ML IJ SOLN: 5.0000 mg | Freq: Once | INTRAMUSCULAR | Status: DC | PRN

## 2016-10-10 MED ORDER — METHYLERGONOVINE MALEATE 0.2 MG/ML IJ SOLN: 0.2000 mg | INTRAMUSCULAR | Status: DC | PRN

## 2016-10-10 MED ORDER — MORPHINE SULFATE (PF) 0.5 MG/ML IJ SOLN
INTRAMUSCULAR | Status: DC | PRN
  Administered 2016-10-10: .2 mg via INTRATHECAL

## 2016-10-10 MED ORDER — IBUPROFEN 600 MG PO TABS
600.0000 mg | ORAL_TABLET | Freq: Four times a day (QID) | ORAL | Status: DC
  Administered 2016-10-10 – 2016-10-13 (×11): 600 mg via ORAL
  Filled 2016-10-10 (×12): qty 1

## 2016-10-10 MED ORDER — METHYLERGONOVINE MALEATE 0.2 MG PO TABS: 0.2000 mg | ORAL_TABLET | ORAL | Status: DC | PRN

## 2016-10-10 MED ORDER — ACETAMINOPHEN 325 MG PO TABS: 650.0000 mg | ORAL_TABLET | ORAL | Status: DC | PRN

## 2016-10-10 MED ORDER — DIBUCAINE 1 % RE OINT: 1.0000 "application " | TOPICAL_OINTMENT | RECTAL | Status: DC | PRN

## 2016-10-10 MED ORDER — DIPHENHYDRAMINE HCL 25 MG PO CAPS
25.0000 mg | ORAL_CAPSULE | ORAL | Status: DC | PRN
  Filled 2016-10-10: qty 1

## 2016-10-10 MED ORDER — BUPIVACAINE IN DEXTROSE 0.75-8.25 % IT SOLN
INTRATHECAL | Status: DC | PRN
  Administered 2016-10-10: 1.5 mL via INTRATHECAL

## 2016-10-10 MED ORDER — PRENATAL MULTIVITAMIN CH
1.0000 | ORAL_TABLET | Freq: Every day | ORAL | Status: DC
  Administered 2016-10-11 – 2016-10-13 (×3): 1 via ORAL
  Filled 2016-10-10 (×3): qty 1

## 2016-10-10 MED ORDER — OXYTOCIN 40 UNITS IN LACTATED RINGERS INFUSION - SIMPLE MED: 2.5000 [IU]/h | INTRAVENOUS | Status: AC

## 2016-10-10 MED ORDER — CEFAZOLIN SODIUM-DEXTROSE 2-4 GM/100ML-% IV SOLN
2.0000 g | INTRAVENOUS | Status: AC
  Administered 2016-10-10: 2 g via INTRAVENOUS
  Filled 2016-10-10: qty 100

## 2016-10-10 MED ORDER — ERYTHROMYCIN 5 MG/GM OP OINT
TOPICAL_OINTMENT | OPHTHALMIC | Status: AC
  Filled 2016-10-10: qty 1

## 2016-10-10 MED ORDER — SCOPOLAMINE 1 MG/3DAYS TD PT72: 1.0000 | MEDICATED_PATCH | TRANSDERMAL | Status: DC

## 2016-10-10 MED ORDER — KETOROLAC TROMETHAMINE 30 MG/ML IJ SOLN
INTRAMUSCULAR | Status: AC
  Administered 2016-10-10: 30 mg via INTRAMUSCULAR
  Filled 2016-10-10: qty 1

## 2016-10-10 MED ORDER — SIMETHICONE 80 MG PO CHEW
80.0000 mg | CHEWABLE_TABLET | ORAL | Status: DC
  Administered 2016-10-13: 80 mg via ORAL
  Filled 2016-10-10 (×3): qty 1

## 2016-10-10 MED ORDER — ALBUTEROL SULFATE (2.5 MG/3ML) 0.083% IN NEBU: 3.0000 mL | INHALATION_SOLUTION | Freq: Four times a day (QID) | RESPIRATORY_TRACT | Status: DC | PRN

## 2016-10-10 MED ORDER — KETOROLAC TROMETHAMINE 30 MG/ML IJ SOLN
30.0000 mg | Freq: Four times a day (QID) | INTRAMUSCULAR | Status: AC | PRN
  Administered 2016-10-10: 30 mg via INTRAMUSCULAR

## 2016-10-10 MED ORDER — NALOXONE HCL 0.4 MG/ML IJ SOLN: 0.4000 mg | INTRAMUSCULAR | Status: DC | PRN

## 2016-10-10 MED ORDER — SOD CITRATE-CITRIC ACID 500-334 MG/5ML PO SOLN
30.0000 mL | Freq: Once | ORAL | Status: AC
  Filled 2016-10-10: qty 15

## 2016-10-10 MED ORDER — LACTATED RINGERS IV SOLN
INTRAVENOUS | Status: DC
  Administered 2016-10-10: 21:00:00 via INTRAVENOUS

## 2016-10-10 MED ORDER — SCOPOLAMINE 1 MG/3DAYS TD PT72
1.0000 | MEDICATED_PATCH | Freq: Once | TRANSDERMAL | Status: AC
  Administered 2016-10-10: 1.5 mg via TRANSDERMAL
  Filled 2016-10-10: qty 1

## 2016-10-10 MED ORDER — DIPHENHYDRAMINE HCL 25 MG PO CAPS: 25.0000 mg | ORAL_CAPSULE | Freq: Four times a day (QID) | ORAL | Status: DC | PRN

## 2016-10-10 MED ORDER — NALBUPHINE HCL 10 MG/ML IJ SOLN
5.0000 mg | Freq: Once | INTRAMUSCULAR | Status: DC | PRN
  Filled 2016-10-10: qty 1

## 2016-10-10 MED ORDER — MEPERIDINE HCL 25 MG/ML IJ SOLN: 6.2500 mg | INTRAMUSCULAR | Status: DC | PRN

## 2016-10-10 MED ORDER — DIPHENHYDRAMINE HCL 50 MG/ML IJ SOLN
INTRAMUSCULAR | Status: AC
  Administered 2016-10-10: 12.5 mg via INTRAVENOUS
  Filled 2016-10-10: qty 1

## 2016-10-10 MED ORDER — MENTHOL 3 MG MT LOZG: 1.0000 | LOZENGE | OROMUCOSAL | Status: DC | PRN

## 2016-10-10 MED ORDER — SENNOSIDES-DOCUSATE SODIUM 8.6-50 MG PO TABS
2.0000 | ORAL_TABLET | ORAL | Status: DC
  Administered 2016-10-10 – 2016-10-13 (×3): 2 via ORAL
  Filled 2016-10-10 (×4): qty 2

## 2016-10-10 MED ORDER — LACTATED RINGERS IV SOLN
INTRAVENOUS | Status: DC | PRN
  Administered 2016-10-10: 40 [IU] via INTRAVENOUS

## 2016-10-10 MED ORDER — DIPHENHYDRAMINE HCL 50 MG/ML IJ SOLN
12.5000 mg | INTRAMUSCULAR | Status: DC | PRN
Start: 2016-10-10 — End: 2016-10-13
  Administered 2016-10-10: 12.5 mg via INTRAVENOUS

## 2016-10-10 MED ORDER — TETANUS-DIPHTH-ACELL PERTUSSIS 5-2.5-18.5 LF-MCG/0.5 IM SUSP: 0.5000 mL | Freq: Once | INTRAMUSCULAR | Status: DC

## 2016-10-10 MED ORDER — NALBUPHINE HCL 10 MG/ML IJ SOLN
5.0000 mg | INTRAMUSCULAR | Status: DC | PRN
  Administered 2016-10-10 – 2016-10-11 (×3): 5 mg via INTRAVENOUS
  Filled 2016-10-10 (×2): qty 1

## 2016-10-10 MED ORDER — FENTANYL CITRATE (PF) 100 MCG/2ML IJ SOLN
INTRAMUSCULAR | Status: DC | PRN
  Administered 2016-10-10: 10 ug via INTRATHECAL

## 2016-10-10 MED ORDER — COCONUT OIL OIL: 1.0000 "application " | TOPICAL_OIL | Status: DC | PRN

## 2016-10-10 MED ORDER — ONDANSETRON HCL 4 MG/2ML IJ SOLN
INTRAMUSCULAR | Status: DC | PRN
Start: 2016-10-10 — End: 2016-10-10
  Administered 2016-10-10: 4 mg via INTRAVENOUS

## 2016-10-10 MED ORDER — SOD CITRATE-CITRIC ACID 500-334 MG/5ML PO SOLN
30.0000 mL | ORAL | Status: AC
  Administered 2016-10-10: 30 mL via ORAL

## 2016-10-10 MED ORDER — SIMETHICONE 80 MG PO CHEW: 80.0000 mg | CHEWABLE_TABLET | ORAL | Status: DC | PRN

## 2016-10-10 MED ORDER — LACTATED RINGERS IV SOLN
INTRAVENOUS | Status: DC | PRN
  Administered 2016-10-10 (×3): via INTRAVENOUS

## 2016-10-10 MED ORDER — FENTANYL CITRATE (PF) 100 MCG/2ML IJ SOLN
INTRAMUSCULAR | Status: AC
  Filled 2016-10-10: qty 2

## 2016-10-10 MED ORDER — PHENYLEPHRINE 8 MG IN D5W 100 ML (0.08MG/ML) PREMIX OPTIME
INJECTION | INTRAVENOUS | Status: DC | PRN
  Administered 2016-10-10: 30 ug/min via INTRAVENOUS

## 2016-10-10 MED ORDER — OXYTOCIN 10 UNIT/ML IJ SOLN
INTRAMUSCULAR | Status: AC
Start: 2016-10-10 — End: 2016-10-10
  Filled 2016-10-10: qty 4

## 2016-10-10 MED ORDER — NALBUPHINE HCL 10 MG/ML IJ SOLN
5.0000 mg | INTRAMUSCULAR | Status: DC | PRN
  Administered 2016-10-11: 5 mg via SUBCUTANEOUS
  Filled 2016-10-10: qty 1

## 2016-10-10 MED ORDER — MORPHINE SULFATE (PF) 0.5 MG/ML IJ SOLN
INTRAMUSCULAR | Status: AC
  Filled 2016-10-10: qty 10

## 2016-10-10 MED ORDER — ONDANSETRON HCL 4 MG/2ML IJ SOLN: 4.0000 mg | Freq: Three times a day (TID) | INTRAMUSCULAR | Status: DC | PRN

## 2016-10-10 MED ORDER — ONDANSETRON HCL 4 MG/2ML IJ SOLN
INTRAMUSCULAR | Status: AC
  Filled 2016-10-10: qty 2

## 2016-10-10 MED ORDER — OXYCODONE HCL 5 MG PO TABS
10.0000 mg | ORAL_TABLET | ORAL | Status: DC | PRN
  Administered 2016-10-11 – 2016-10-13 (×6): 10 mg via ORAL
  Filled 2016-10-10 (×6): qty 2

## 2016-10-10 MED ORDER — SIMETHICONE 80 MG PO CHEW
80.0000 mg | CHEWABLE_TABLET | Freq: Three times a day (TID) | ORAL | Status: DC
  Administered 2016-10-10 – 2016-10-13 (×11): 80 mg via ORAL
  Filled 2016-10-10 (×8): qty 1

## 2016-10-10 MED ORDER — EPHEDRINE 5 MG/ML INJ
INTRAVENOUS | Status: AC
  Filled 2016-10-10: qty 10

## 2016-10-10 MED ORDER — ZOLPIDEM TARTRATE 5 MG PO TABS: 5.0000 mg | ORAL_TABLET | Freq: Every evening | ORAL | Status: DC | PRN

## 2016-10-10 MED ORDER — EPHEDRINE SULFATE 50 MG/ML IJ SOLN
INTRAMUSCULAR | Status: DC | PRN
  Administered 2016-10-10: 10 mg via INTRAVENOUS

## 2016-10-10 SURGICAL SUPPLY — 36 items
CHLORAPREP W/TINT 26ML (MISCELLANEOUS) ×6 IMPLANT
CLAMP CORD UMBIL (MISCELLANEOUS) IMPLANT
CLOTH BEACON ORANGE TIMEOUT ST (SAFETY) ×3 IMPLANT
DERMABOND ADVANCED (GAUZE/BANDAGES/DRESSINGS) ×2
DERMABOND ADVANCED .7 DNX12 (GAUZE/BANDAGES/DRESSINGS) ×1 IMPLANT
DRSG OPSITE POSTOP 4X10 (GAUZE/BANDAGES/DRESSINGS) ×3 IMPLANT
ELECT REM PT RETURN 9FT ADLT (ELECTROSURGICAL) ×3
ELECTRODE REM PT RTRN 9FT ADLT (ELECTROSURGICAL) ×1 IMPLANT
EXTRACTOR VACUUM BELL STYLE (SUCTIONS) IMPLANT
GLOVE BIOGEL PI IND STRL 7.0 (GLOVE) ×1 IMPLANT
GLOVE BIOGEL PI IND STRL 8 (GLOVE) ×1 IMPLANT
GLOVE BIOGEL PI INDICATOR 7.0 (GLOVE) ×2
GLOVE BIOGEL PI INDICATOR 8 (GLOVE) ×2
GLOVE ECLIPSE 8.0 STRL XLNG CF (GLOVE) ×3 IMPLANT
GOWN STRL REUS W/TWL LRG LVL3 (GOWN DISPOSABLE) ×6 IMPLANT
KIT ABG SYR 3ML LUER SLIP (SYRINGE) ×3 IMPLANT
NEEDLE HYPO 18GX1.5 BLUNT FILL (NEEDLE) ×3 IMPLANT
NEEDLE HYPO 22GX1.5 SAFETY (NEEDLE) ×3 IMPLANT
NEEDLE HYPO 25X5/8 SAFETYGLIDE (NEEDLE) ×3 IMPLANT
NS IRRIG 1000ML POUR BTL (IV SOLUTION) ×3 IMPLANT
PACK C SECTION WH (CUSTOM PROCEDURE TRAY) ×3 IMPLANT
PAD OB MATERNITY 4.3X12.25 (PERSONAL CARE ITEMS) ×3 IMPLANT
PENCIL SMOKE EVAC W/HOLSTER (ELECTROSURGICAL) ×3 IMPLANT
RTRCTR C-SECT PINK 25CM LRG (MISCELLANEOUS) IMPLANT
SUT CHROMIC 0 CT 1 (SUTURE) ×3 IMPLANT
SUT MNCRL 0 VIOLET CTX 36 (SUTURE) ×2 IMPLANT
SUT MONOCRYL 0 CTX 36 (SUTURE) ×4
SUT PLAIN 2 0 (SUTURE)
SUT PLAIN 2 0 XLH (SUTURE) IMPLANT
SUT PLAIN ABS 2-0 CT1 27XMFL (SUTURE) IMPLANT
SUT VIC AB 0 CTX 36 (SUTURE) ×2
SUT VIC AB 0 CTX36XBRD ANBCTRL (SUTURE) ×1 IMPLANT
SUT VIC AB 4-0 KS 27 (SUTURE) IMPLANT
SYR 20CC LL (SYRINGE) ×6 IMPLANT
TOWEL OR 17X24 6PK STRL BLUE (TOWEL DISPOSABLE) ×3 IMPLANT
TRAY FOLEY CATH SILVER 14FR (SET/KITS/TRAYS/PACK) IMPLANT

## 2016-10-10 NOTE — Anesthesia Procedure Notes (Signed)
Spinal  Staffing Anesthesiologist: Marcene DuosFITZGERALD, Jhonatan Lomeli Performed: anesthesiologist  Preanesthetic Checklist Completed: patient identified, site marked, surgical consent, pre-op evaluation, timeout performed, IV checked, risks and benefits discussed and monitors and equipment checked Spinal Block Patient position: sitting Prep: site prepped and draped and DuraPrep Patient monitoring: heart rate, continuous pulse ox and blood pressure Approach: midline Location: L4-5 Injection technique: single-shot Needle Needle type: Pencan  Needle gauge: 24 G Needle length: 9 cm Needle insertion depth: 6 cm Assessment Sensory level: T6

## 2016-10-10 NOTE — H&P (Signed)
Kim [name] is a 26 y.o. female presenting for repeat Caesarean section [id number] [redacted]w[redacted]d  Declines trial of labor Gestational hypertension on no meds  Pt of [facility] originally . OB History    Gravida Para Term Preterm AB Living   4 1 1   2 1    SAB TAB Ectopic Multiple Live Births     2     1      Obstetric Comments   EAB x 2 (one in clinic procedure and one medical). Scheduled c-section b/c they said her pelvis was too long and narrow. Pt states she never went into labor.      Past Medical History:  Diagnosis Date  . Anxiety   . Asthma   . Headache   . History of chlamydia   . Infection    UTI  . Pregnancy induced hypertension    Past Surgical History:  Procedure Laterality Date  . CESAREAN SECTION     Family History: family history includes Cancer in her maternal grandmother; Drug abuse in her father; Hypertension in her mother. Social History:  reports that she has never smoked. She has never used smokeless tobacco. She reports that she drinks alcohol. She reports that she does not use drugs.     Maternal Diabetes: No Genetic Screening: Normal Maternal Ultrasounds/Referrals: Normal Fetal Ultrasounds or other Referrals:  None Maternal Substance Abuse:  No Significant Maternal Medications:  None Significant Maternal Lab Results:  None Other Comments:    ROS   Review of Systems  Constitutional: Negative for fever, chills, weight loss, malaise/fatigue and diaphoresis.  HENT: Negative for hearing loss, ear pain, nosebleeds, congestion, sore throat, neck pain, tinnitus and ear discharge.   Eyes: Negative for blurred vision, double vision, photophobia, pain, discharge and redness.  Respiratory: Negative for cough, hemoptysis, sputum production, shortness of breath, wheezing and stridor.   Cardiovascular: Negative for chest pain, palpitations, orthopnea, claudication, leg swelling and PND.  Gastrointestinal: negative for abdominal pain. Negative for heartburn, nausea,  vomiting, diarrhea, constipation, blood in stool and melena.  Genitourinary: Negative for dysuria, urgency, frequency, hematuria and flank pain.  Musculoskeletal: Negative for myalgias, back pain, joint pain and falls.  Skin: Negative for itching and rash.  Neurological: Negative for dizziness, tingling, tremors, sensory change, speech change, focal weakness, seizures, loss of consciousness, weakness and headaches.  Endo/Heme/Allergies: Negative for environmental allergies and polydipsia. Does not bruise/bleed easily.  Psychiatric/Behavioral: Negative for depression, suicidal ideas, hallucinations, memory loss and substance abuse. The patient is not nervous/anxious and does not have insomnia.       History  Physical Exam  Vitals reviewed. Constitutional: She is oriented to person, place, and time. She appears well-developed and well-nourished.  HENT:  Head: Normocephalic and atraumatic.  Right Ear: External ear normal.  Left Ear: External ear normal.  Nose: Nose normal.  Mouth/Throat: Oropharynx is clear and moist.  Eyes: Conjunctivae and EOM are normal. Pupils are equal, round, and reactive to light. Right eye exhibits no discharge. Left eye exhibits no discharge. No scleral icterus.  Neck: Normal range of motion. Neck supple. No tracheal deviation present. No thyromegaly present.  Cardiovascular: Normal rate, regular rhythm, normal heart sounds and intact distal pulses.  Exam reveals no gallop and no friction rub.   No murmur heard. Respiratory: Effort normal and breath sounds normal. No respiratory distress. She has no wheezes. She has no rales. She exhibits no tenderness.  GI: Soft. Bowel sounds are normal. She exhibits no distension and no mass. There is tenderness.  There is no rebound and no guarding.  Genitourinary:       Vulva is normal without lesions Vagina is pink moist without discharge Cervix normal in appearance and pap is normal Uterus is size equals dates Adnexa is  negative with normal sized ovaries by sonogram  Musculoskeletal: Normal range of motion. She exhibits no edema and no tenderness.  Neurological: She is alert and oriented to person, place, and time. She has normal reflexes. She displays normal reflexes. No cranial nerve deficit. She exhibits normal muscle tone. Coordination normal.  Skin: Skin is warm and dry. No rash noted. No erythema. No pallor.  Psychiatric: She has a normal mood and affect. Her behavior is normal. Judgment and thought content normal.      Blood pressure 134/80, pulse (!) 101, temperature 98 F (36.7 C), temperature source Oral, resp. rate 18, height 5\' 1"  (1.549 m), weight 200 lb (90.7 kg), last menstrual period 01/11/2016. Exam Physical Exam  Prenatal labs: ABO, Rh: --/--/O POS (01/12 1015) Antibody: NEG (05/15 1620) Rubella: 5.62 (07/20 1546) RPR: Non Reactive (01/12 1015)  HBsAg: Negative (07/20 1546)  HIV: Non Reactive (10/25 1025)  GBS: Negative (12/28 0950)   Assessment/Plan: 2042w0d Estimated Date of Delivery: 10/17/16  Previous Caesarean section x1, desires repeat, declines TOL  Repeat caesarean section   Maykayla Highley H 10/10/2016, 8:10 AM

## 2016-10-10 NOTE — Lactation Note (Signed)
This note was copied from a baby's chart. Lactation Consultation Note  Patient Name: Kim [name] GNFAO'ZToday's Date: 10/10/2016 Reason for consult: Initial assessment  Initial visit at 6 hours of life. Mom is a P2 who nursed her 1st child for 9 months w/o difficulty. Mom states that this infant is latching well.  Mom made aware of O/P services, breastfeeding support groups, community resources, and our phone # for post-discharge questions.   [name], [name] 10/10/2016, 5:10 PM

## 2016-10-10 NOTE — Transfer of Care (Signed)
Immediate Anesthesia Transfer of Care Note  Patient: Kim [name]  Procedure(s) Performed: Procedure(s): CESAREAN SECTION (N/A)  Patient Location: PACU  Anesthesia Type:Spinal  Level of Consciousness: awake  Airway & Oxygen Therapy: Patient Spontanous Breathing  Post-op Assessment: Report given to RN  Post vital signs: Reviewed and stable  Last Vitals:  Vitals:   10/10/16 0730 10/10/16 0738  BP: 134/80   Pulse: (!) 101   Resp: 18   Temp:  36.7 C    Last Pain:  Vitals:   10/10/16 0739  TempSrc:   PainSc: 0-No pain         Complications: No apparent anesthesia complications

## 2016-10-10 NOTE — Anesthesia Postprocedure Evaluation (Signed)
Anesthesia Post Note  Patient: Kim [name]  Procedure(s) Performed: Procedure(s) (LRB): CESAREAN SECTION (N/A)  Patient location during evaluation: PACU Anesthesia Type: Spinal Level of consciousness: oriented and awake and alert Pain management: pain level controlled Vital Signs Assessment: post-procedure vital signs reviewed and stable Respiratory status: spontaneous breathing, respiratory function stable and patient connected to nasal cannula oxygen Cardiovascular status: blood pressure returned to baseline and stable Postop Assessment: no headache, no backache and spinal receding Anesthetic complications: no        Last Vitals:  Vitals:   10/10/16 1230 10/10/16 1245  BP: (!) 148/83 (!) 154/83  Pulse: 93 97  Resp: 17 13  Temp:  36.9 C    Last Pain:  Vitals:   10/10/16 1245  TempSrc: Axillary  PainSc: 0-No pain   Pain Goal:                 Kennieth RadFitzgerald, Ladye Macnaughton E

## 2016-10-10 NOTE — Anesthesia Preprocedure Evaluation (Signed)
Anesthesia Evaluation  Patient identified by MRN, date of birth, ID band Patient awake    Reviewed: Allergy & Precautions, NPO status , Patient's Chart, lab work & pertinent test results  Airway Mallampati: II  TM Distance: >3 FB Neck ROM: Full    Dental   Pulmonary asthma ,    breath sounds clear to auscultation       Cardiovascular hypertension,  Rhythm:Regular Rate:Normal     Neuro/Psych negative neurological ROS     GI/Hepatic negative GI ROS, Neg liver ROS,   Endo/Other  negative endocrine ROS  Renal/GU negative Renal ROS     Musculoskeletal   Abdominal   Peds  Hematology negative hematology ROS (+)   Anesthesia Other Findings   Reproductive/Obstetrics (+) Pregnancy                             Lab Results  Component Value Date   WBC 10.0 10/10/2016   HGB 11.7 (L) 10/10/2016   HCT 35.4 (L) 10/10/2016   MCV 79.6 10/10/2016   PLT 310 10/10/2016   Lab Results  Component Value Date   CREATININE 0.57 10/08/2016   BUN 5 (L) 10/08/2016   NA 134 (L) 10/08/2016   K 4.4 10/08/2016   CL 104 10/08/2016   CO2 24 10/08/2016    Anesthesia Physical Anesthesia Plan  ASA: II  Anesthesia Plan: Spinal   Post-op Pain Management:    Induction:   Airway Management Planned: Natural Airway  Additional Equipment:   Intra-op Plan:   Post-operative Plan:   Informed Consent: I have reviewed the patients History and Physical, chart, labs and discussed the procedure including the risks, benefits and alternatives for the proposed anesthesia with the patient or authorized representative who has indicated his/her understanding and acceptance.     Plan Discussed with:   Anesthesia Plan Comments:         Anesthesia Quick Evaluation

## 2016-10-10 NOTE — Op Note (Signed)
Preoperative diagnosis:  1.  Intrauterine pregnancy at 1297w0d  weeks gestation                                         2.  Previous Caesarean section                                         3.  Declines TOL                                        4.  Gestational hypertension   Postoperative diagnosis:  Same as above plus severe adhesions of uterus to anterior abdominal wall with bladder adherent high on abdominal wall as well  Procedure:  Repeat cesarean section  Surgeon:  Lazaro ArmsLuther H Lauryl Seyer MD  Assistant:    Anesthesia: Spinal  Findings:  dense adhesion of uterus to abdominal wall, very small window to deliver baby, used vacuum and had to approach almost like a vaginal vacuum delivery, had to let tissue stretch as well as fetal vertex.    Over a low transverse incision was delivered a viable female with Apgars of 9 and 9 weighing pending lbs.  oz. Uterus, tubes and ovaries were all normal.  There were no other significant findings  Description of operation:  Patient was taken to the operating room and placed in the sitting position where she underwent a spinal anesthetic. She was then placed in the supine position with tilt to the left side. When adequate anesthetic level was obtained she was prepped and draped in usual sterile fashion and a Foley catheter was placed. A Pfannenstiel skin incision was made and carried down sharply to the rectus fascia which was scored in the midline extended laterally. The fascia was taken off the muscles both superiorly and without difficulty. The muscles were divided.  The peritoneal cavity was entered.  Bladder blade was placed, no bladder flap was created.  A low transverse hysterotomy incision was made and delivered a viable female  infant at [id number] with Apgars of 9 and 9 weighingpending lbs  oz.  Vacuum extractor was used as described above.  Cord pH was obtained and was 7.20. The uterus was exteriorized. It was closed in 2 layers, the first being a running  interlocking layer and the second being an imbricating layer using 0 monocryl on a CTX needle. There was good resulting hemostasis. The uterus tubes and ovaries were all normal. Peritoneal cavity was irrigated vigorously. The muscles and peritoneum were reapproximated loosely. The fascia was closed using 0 Vicryl in running fashion. Subcutaneous tissue was made hemostatic and irrigated. The skin was closed using 4-0 Vicryl on a Keith needle in a subcuticular fashion.  Dermabond was placed for additional wound integrity and to serve as a barrier. Blood loss for the procedure was 650 cc. The patient received 2 grams of Ancef prophylactically. The patient was taken to the recovery room in good stable condition with all counts being correct x3.  EBL 650 cc  Makara Lanzo H 10/10/2016 11:18 AM

## 2016-10-11 LAB — CBC
HEMATOCRIT: 29 % — AB (ref 36.0–46.0)
Hemoglobin: 9.7 g/dL — ABNORMAL LOW (ref 12.0–15.0)
MCH: 26.6 pg (ref 26.0–34.0)
MCHC: 33.4 g/dL (ref 30.0–36.0)
MCV: 79.7 fL (ref 78.0–100.0)
PLATELETS: 236 10*3/uL (ref 150–400)
RBC: 3.64 MIL/uL — ABNORMAL LOW (ref 3.87–5.11)
RDW: 14.8 % (ref 11.5–15.5)
WBC: 9.7 10*3/uL (ref 4.0–10.5)

## 2016-10-11 NOTE — Addendum Note (Signed)
Addendum  created 10/11/16 1523 by Jhonatan Lomeli M Cayden Rautio, CRNA   Sign clinical note    

## 2016-10-11 NOTE — Progress Notes (Signed)
CLINICAL SOCIAL WORK MATERNAL/CHILD NOTE  Patient Details  Name: Kim [name] MRN: [id number] Date of Birth: 10/10/2017  Date:  10/11/2016  Clinical Social Worker Initiating Note:  Terri Piedra, Lexa Date/ Time Initiated:  10/11/16/1330     Child's Name:  Kim [name]   Legal Guardian:  Mother Kim [name])   Need for Interpreter:  None   Date of Referral:  10/11/16     Reason for Referral:  Other (Comment) (Hx of Anxiety)   Referral Source:  [facility]   Address:  [address] [place], [place] [address]  Phone number:  [phone]   Household Members:  Minor Children (MOB has one other child, Kim [name], age 40)   Natural Supports (not living in the home):  Immediate Family, Extended Family (MOB reports that she has a good support system.  Her sister (present with her today) lives down the street.  MGM is visiting for a week from [place].  FOB is incarcerated, but his family lives locally and are supportive.  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  MOB's facesheet notes religion as [place].  Strengths:  Ability to meet basic needs , Pediatrician chosen , Home prepared for child  Magazine features editor)   Risk Factors/Current Problems:  None   Cognitive State:  Able to Concentrate , Alert , Linear Thinking , Goal Oriented    Mood/Affect:  Flat , Calm , Interested    CSW Assessment: CSW met with MOB and her sister in her first floor room/148 to offer support and complete assessment due to hx of Anxiety.  MOB was was pleasant and welcoming, however, presents with a slightly flat affect.  She was open to talking with CSW and gave permission to speak with her with her sister present. MOB reports that she is feeling well, other than a little sore from  surgery.  She feels she is already healing better than her first c-section.  She states her mother is visiting from [place], and currently caring for her 18 year old daughter while she is in the hospital with baby.  Her sister was sitting in the chair holding baby and talking on the phone, not involved in the conversation.   MOB reports that she has a good support system through her sister, who "lives down the street," her mother, although she does not live locally, and FOB's family.  She states FOB/Kim [name] is currently incarcerated in [place], and that they are not in a relationship.  She states she still has contact with him by phone and that he knows the baby has been born.  She reports that he is not the father of her first child and that her first child's father is involved and supportive to child.  MOB reports having all supplies for infant at home. CSW inquired about how she is feeling emotionally now and throughout her pregnancy.  She reports feeling well and denies hx of PMADs.  CSW provided information regarding PMADs and encouraged her to speak with a medical professional if she has concerns about her mental health at any time.  MOB reports having dealt with Anxiety in the past because of "normal life stuff like rent, bills, and losing my dad and grandpa."  She reports that  her father died in 55 and her grandfather died in 09-09-2016.  She does not feel her anxiety interferes with daily living.  She was readily able to identify coping mechanisms she uses to cope with stress, such as focusing on work or her child to "get my mind off of it," or listening to music.  CSW provided information fr support groups held at [facility] and gave MOB a "New Mom Checklist" as a Occupational psychologist.  She thanked CSW and states no questions, concerns or needs at this time.  CSW Plan/Description:  No Further Intervention Required/No Barriers to Discharge, Patient/Family Education ,  Information/Referral to [place], [place], [place] 10/11/2016, 3:14 PM

## 2016-10-11 NOTE — Lactation Note (Signed)
This note was copied from a baby's chart. Lactation Consultation Note  Patient Name: [name] OZHYQ'MToday's Date: 10/11/2016 Reason for consult: Follow-up assessment  Follow up visit at 36 hours of age.  Baby has had 8 feedings in 24 hours with good output.  Mom denies concerns at this time.  Mom to call as needed.    Maternal Data Has patient been taught Hand Expression?: Yes  Feeding Feeding Type: Breast Fed Length of feed: 5 min  LATCH Score/Interventions                      Lactation Tools Discussed/Used     Consult Status Consult Status: Follow-up Date: 10/12/16 Follow-up type: In-patient    Jannifer RodneyShoptaw, Jana Lynn 10/11/2016, 11:03 PM

## 2016-10-11 NOTE — Progress Notes (Signed)
POSTPARTUM PROGRESS NOTE  Post Operative Day 1 Subjective:  Kim [name] is a 26 y.o. [id number] 3910w0d s/p RLTCS.  No acute events overnight.  Pt denies problems with ambulating, voiding or po intake.  She denies nausea or vomiting.  Pain is well controlled.  She has had flatus. She has had bowel movement.  Lochia Small.   Objective: Blood pressure (!) 100/46, pulse 85, temperature 98.4 F (36.9 C), temperature source Oral, resp. rate 18, height 5\' 1"  (1.549 m), weight 200 lb (90.7 kg), last menstrual period 01/11/2016, SpO2 100 %, unknown if currently breastfeeding.  Physical Exam:  General: alert, cooperative and no distress Lochia:normal flow Chest: no respiratory distress Heart:regular rate, distal pulses intact Incision: c/d/i Abdomen: soft, nontender,  Uterine Fundus: firm, appropriately tender DVT Evaluation: No calf swelling or tenderness Extremities: trace edema   Recent Labs  10/10/16 0703 10/11/16 0538  HGB 11.7* 9.7*  HCT 35.4* 29.0*    Assessment/Plan:  ASSESSMENT: Kim [name] is a 26 y.o. [id number] 4710w0d s/p RLTCS  Plan for discharge tomorrow and Breastfeeding   LOS: 1 day   Les Pouicholas SchenkMD 10/11/2016, 5:42 PM

## 2016-10-11 NOTE — Addendum Note (Signed)
Addendum  created 10/11/16 2033 by Marcene Duosobert Bionca Mckey, MD   Anesthesia Staff edited

## 2016-10-11 NOTE — Anesthesia Postprocedure Evaluation (Addendum)
Anesthesia Post Note  Patient: Kim [name]  Procedure(s) Performed: Procedure(s) (LRB): CESAREAN SECTION (N/A)  Patient location during evaluation: Mother Baby Anesthesia Type: Spinal Level of consciousness: awake and alert and oriented Pain management: satisfactory to patient Vital Signs Assessment: post-procedure vital signs reviewed and stable Respiratory status: spontaneous breathing and nonlabored ventilation Cardiovascular status: stable Postop Assessment: no headache, no backache, patient able to bend at knees, no signs of nausea or vomiting and adequate PO intake Anesthetic complications: no        Last Vitals:  Vitals:   10/11/16 0324 10/11/16 0551  BP: 109/61 (!) 100/46  Pulse: 73 85  Resp:  18  Temp: 37.3 C 36.9 C    Last Pain:  Vitals:   10/11/16 1148  TempSrc:   PainSc: 6    Pain Goal:                 GREGORY,SUZANNE

## 2016-10-12 LAB — TYPE AND SCREEN
Blood Product Expiration Date: 201801242359
Blood Product Expiration Date: 201802012359
UNIT TYPE AND RH: 5100
Unit Type and Rh: 5100

## 2016-10-12 MED ORDER — MUPIROCIN CALCIUM 2 % EX CREA
TOPICAL_CREAM | Freq: Two times a day (BID) | CUTANEOUS | Status: DC
  Administered 2016-10-12 – 2016-10-13 (×2): via TOPICAL
  Filled 2016-10-12: qty 15

## 2016-10-12 NOTE — Progress Notes (Signed)
Subjective: Postpartum Day #2: Cesarean Delivery Patient reports incisional pain, tolerating PO, + flatus and no problems voiding.    Objective: Vital signs in last 24 hours: Temp:  [98 F (36.7 C)-98.7 F (37.1 C)] 98.7 F (37.1 C) (01/16 0500) Pulse Rate:  [83-91] 91 (01/16 0500) Resp:  [18] 18 (01/16 0500) BP: (107-116)/(44-47) 107/47 (01/16 0500)  Physical Exam:  General: alert, cooperative and no distress Lochia: appropriate Uterine Fundus: firm Incision: no significant drainage, no dehiscence, no significant erythema DVT Evaluation: No evidence of DVT seen on physical exam. No cords or calf tenderness. No significant calf/ankle edema.   Recent Labs  10/10/16 0703 10/11/16 0538  HGB 11.7* 9.7*  HCT 35.4* 29.0*    Assessment/Plan: Status post Cesarean section. Doing well postoperatively.   Increased pain today, K-Pad ordered.   Continue current care.  Anticipate discharge home tomorrow. Planning Depo for contraception.   Roe Coombsachelle A Shann Lewellyn, CNM 10/12/2016, 8:01 AM

## 2016-10-12 NOTE — Lactation Note (Signed)
This note was copied from a baby's chart. Lactation Consultation Note  Patient Name: Girl Windy Cannyaniqua [place] QQVZD'GToday's Date: 10/12/2016 Reason for consult: Follow-up assessment Baby at 59 hr of life. Upon entry baby was coming off the breast. Experienced bf mom reports latching is going well. She denies breast or nipple pain, voiced no concerns. Discussed baby behavior, feeding frequency, baby belly size, voids, wt loss, breast changes, and nipple care. She can manually express and has spoon in room. She is aware of lactation services and support group. She will call as needed.    Maternal Data    Feeding Feeding Type: Breast Fed Length of feed: 20 min  LATCH Score/Interventions                      Lactation Tools Discussed/Used     Consult Status Consult Status: Follow-up Date: 10/13/16 Follow-up type: In-patient    Rulon Eisenmengerlizabeth E Zelena Bushong 10/12/2016, 10:40 PM

## 2016-10-13 MED ORDER — OXYCODONE-ACETAMINOPHEN 5-325 MG PO TABS: 1.0000 | ORAL_TABLET | ORAL | 0 refills | Status: DC | PRN

## 2016-10-13 MED ORDER — IBUPROFEN 600 MG PO TABS: 600.0000 mg | ORAL_TABLET | Freq: Four times a day (QID) | ORAL | 0 refills | Status: DC | PRN

## 2016-10-13 NOTE — Lactation Note (Signed)
This note was copied from a baby'[name] chart. Lactation Consultation Note  Patient Name: Kim [name] WUJWJ'XToday'[name] Date: 10/13/2016  Follow up visit made.  Mom states her milk is in and breasts are full and leaking.  She states baby is latching and nursing well.  No concerns at present.  Encouraged to call with concerns prn.   Maternal Data    Feeding Feeding Type: Breast Fed Length of feed: 30 min  LATCH Score/Interventions Latch: Grasps breast easily, tongue down, lips flanged, rhythmical sucking.  Audible Swallowing: A few with stimulation Intervention([name]): Skin to skin;Hand expression  Type of Nipple: Everted at rest and after stimulation  Comfort (Breast/Nipple): Soft / non-tender     Hold (Positioning): No assistance needed to correctly position infant at breast. Intervention([name]): Skin to skin  LATCH Score: 9  Lactation Tools Discussed/Used     Consult Status      Kim [name], Kim [name] 10/13/2016, 9:52 AM

## 2016-10-13 NOTE — Discharge Summary (Signed)
OB Discharge Summary     Patient Name: Kim [name] DOB: 12/17/1990 MRN: [id number]  Date of admission: 10/10/2016 Delivering MD: Duane LopeEURE, LUTHER H   Date of discharge: 10/13/2016  Admitting diagnosis: c section cpt [id number] - REPEAT c-section Intrauterine pregnancy: [id number]     Secondary diagnosis:  Active Problems:   History of cesarean delivery  Additional problems: chtn vs. ghtn     Discharge diagnosis: Term Pregnancy Delivered and chtn vs. ghtn- difficult to ascertain from notes                                                                                                Post partum procedures:none  Augmentation: n/a  Complications: None  Hospital course:  Sceduled C/S   26 y.o. yo [id number] at 4159w0d was admitted to the hospital 10/10/2016 for scheduled cesarean section with the following indication:Elective Repeat.  Membrane Rupture Time/Date: 10:46 AM ,10/10/2016   Patient delivered a Viable infant.10/10/2016  Details of operation can be found in separate operative note.  Pateint had an uncomplicated postpartum course.  She is ambulating, tolerating a regular diet, passing flatus, and urinating well. Patient is discharged home in stable condition on  10/13/16  Eating, drinking, voiding, ambulating well.  +flatus.  Lochia and pain wnl.  Denies dizziness, lightheadedness, or sob. No complaints.           Physical exam Vitals:   10/11/16 0800 10/11/16 1757 10/12/16 0500 10/13/16 0545  BP: (!) 111/50 (!) 116/44 (!) 107/47 124/63  Pulse: 89 83 91 (!) 108  Resp: 20 18 18 20   Temp: 98.8 F (37.1 C) 98 F (36.7 C) 98.7 F (37.1 C) 99.3 F (37.4 C)  TempSrc:  Oral Oral Oral  SpO2: 100%     Weight:      Height:       General: alert, cooperative and no distress Lochia: appropriate Uterine Fundus: firm Incision: healing well, no s/s infection, no erythema, no drainage. Small area of 3 blisters on Lt side of incision where clear tape from honeycomb was yesterday, had popped  yesterday when honeycomb removed by Dr. Omer JackMumaw, appear normal, no s/s infection- very likely reaction to adhesive DVT Evaluation: No evidence of DVT seen on physical exam. Negative Homan's sign. No cords or calf tenderness. No significant calf/ankle edema. Labs: Lab Results  Component Value Date   WBC 9.7 10/11/2016   HGB 9.7 (L) 10/11/2016   HCT 29.0 (L) 10/11/2016   MCV 79.7 10/11/2016   PLT 236 10/11/2016   CMP Latest Ref Rng & Units 10/10/2016  Glucose 65 - 99 mg/dL 85  BUN 6 - 20 mg/dL 7  Creatinine 0.860.44 - 5.781.00 mg/dL 4.690.55  Sodium 629135 - 528145 mmol/L 133(L)  Potassium 3.5 - 5.1 mmol/L 4.2  Chloride 101 - 111 mmol/L 103  CO2 22 - 32 mmol/L 21(L)  Calcium 8.9 - 10.3 mg/dL 8.9  Total Protein 6.5 - 8.1 g/dL 7.3  Total Bilirubin 0.3 - 1.2 mg/dL 0.5  Alkaline Phos 38 - 126 U/L 134(H)  AST 15 - 41 U/L 21  ALT 14 - 54 U/L 14  Discharge instruction: per After Visit Summary and "Baby and Me Booklet".  After visit meds:  Allergies as of 10/13/2016   No Known Allergies     Medication List    TAKE these medications   albuterol 108 (90 Base) MCG/ACT inhaler Commonly known as:  PROVENTIL HFA;VENTOLIN HFA Inhale 2 puffs into the lungs every 6 (six) hours as needed for wheezing or shortness of breath.   calcium carbonate 500 MG chewable tablet Commonly known as:  TUMS - dosed in mg elemental calcium Chew 2 tablets by mouth as needed for indigestion or heartburn.   ibuprofen 600 MG tablet Commonly known as:  ADVIL,MOTRIN Take 1 tablet (600 mg total) by mouth every 6 (six) hours as needed for mild pain, moderate pain or cramping.   oxyCODONE-acetaminophen 5-325 MG tablet Commonly known as:  PERCOCET/ROXICET Take 1-2 tablets by mouth every 4 (four) hours as needed for severe pain.   PRENATAL GUMMIES/DHA & FA 0.4-32.5 MG Chew Chew 2 each by mouth daily.       Diet: routine diet  Activity: Advance as tolerated. Pelvic rest for 6 weeks.   Outpatient follow up:4-6wks for  postpartum visit Follow up Appt:No future appointments. Follow up Visit:No Follow-up on file.  Baby love visit in 1wk for bp check d/t chtn vs. Ghtn, bp's here good  Postpartum contraception: abstinence until depo  Newborn Data: Live born female  Birth Weight: 6 lb 7.2 oz (2926 g) APGAR: 9, 9  Baby Feeding: br/bottle Disposition:home with mother pending d/c from peds   10/13/2016 Marge Duncans, CNM

## 2016-10-13 NOTE — Discharge Instructions (Signed)
Cesarean Delivery, Care After °Refer to this sheet in the next few weeks. These instructions provide you with information about caring for yourself after your procedure. Your health care provider may also give you more specific instructions. Your treatment has been planned according to current medical practices, but problems sometimes occur. Call your health care provider if you have any problems or questions after your procedure. °What can I expect after the procedure? °After the procedure, it is common to have: °· A small amount of blood or clear fluid coming from the incision. °· Some redness, swelling, and pain in your incision area. °· Some abdominal pain and soreness. °· Vaginal bleeding (lochia). °· Pelvic cramps. °· Fatigue. °Follow these instructions at home: °Incision care  ° °· Follow instructions from your health care provider about how to take care of your incision. Make sure you: °¨ Wash your hands with soap and water before you change your bandage (dressing). If soap and water are not available, use hand sanitizer. °¨ Change your dressing as told by your health care provider. °¨ Leave stitches (sutures), skin staples, skin glue, or adhesive strips in place. These skin closures may need to stay in place for 2 weeks or longer. If adhesive strip edges start to loosen and curl up, you may trim the loose edges. Do not remove adhesive strips completely unless your health care provider tells you to do that. °· Check your incision area every day for signs of infection. Check for: °¨ More redness, swelling, or pain. °¨ More fluid or blood. °¨ Warmth. °¨ Pus or a bad smell. °· When you cough or sneeze, hug a pillow. This helps with pain and decreases the chance of your incision opening up (dehiscing). Do this until your incision heals. °Medicines  °· Take over-the-counter and prescription medicines only as told by your health care provider. °· If you were prescribed an antibiotic medicine, take it as told by  your health care provider. Do not stop taking the antibiotic until it is finished. °Driving  °· Do not drive or operate heavy machinery while taking prescription pain medicine. °· Do not drive for 24 hours if you received a sedative. °Lifestyle  °· Do not drink alcohol. This is especially important if you are breastfeeding or taking pain medicine. °· Do not use tobacco products, including cigarettes, chewing tobacco, or e-cigarettes. If you need help quitting, ask your health care provider. Tobacco can delay wound healing. °Eating and drinking  °· Drink at least 8 eight-ounce glasses of water every day unless told not to by your health care provider. If you breastfeed, you may need to drink more water than this. °· Eat high-fiber foods every day. These foods may help prevent or relieve constipation. High-fiber foods include: °¨ Whole grain cereals and breads. °¨ Brown rice. °¨ Beans. °¨ Fresh fruits and vegetables. °Activity  °· Return to your normal activities as told by your health care provider. Ask your health care provider what activities are safe for you. °· Rest as much as possible. Try to rest or take a nap while your baby is sleeping. °· Do not lift anything that is heavier than your baby or 10 lb (4.5 kg) as told by your health care provider. °· Ask your health care provider when you can engage in sexual activity. This may depend on your: °¨ Risk of infection. °¨ Healing rate. °¨ Comfort and desire to engage in sexual activity. °Bathing  °· Do not take baths, swim, or use a   hot tub until your health care provider approves. Ask your health care provider if you can take showers. You may only be allowed to take sponge baths until your incision heals. °· Keep your dressing dry as told by your health care provider. °General instructions  °· Do not use tampons or douches until your health care provider approves. °· Wear: °¨ Loose, comfortable clothing. °¨ A supportive and well-fitting bra. °· Watch for any blood  clots that may pass from your vagina. These may look like clumps of dark red, brown, or black discharge. °· Keep your perineum clean and dry as told by your health care provider. °· Wipe from front to back when you use the toilet. °· If possible, have someone help you care for your baby and help with household activities for a few days after you leave the hospital. °· Keep all follow-up visits for you and your baby as told by your health care provider. This is important. °Contact a health care provider if: °· You have: °¨ Bad-smelling vaginal discharge. °¨ Difficulty urinating. °¨ Pain when urinating. °¨ A sudden increase or decrease in the frequency of your bowel movements. °¨ More redness, swelling, or pain around your incision. °¨ More fluid or blood coming from your incision. °¨ Pus or a bad smell coming from your incision. °¨ A fever. °¨ A rash. °¨ Little or no interest in activities you used to enjoy. °¨ Questions about caring for yourself or your baby. °¨ Nausea. °· Your incision feels warm to the touch. °· Your breasts turn red or become painful or hard. °· You feel unusually sad or worried. °· You vomit. °· You pass large blood clots from your vagina. If you pass a blood clot, save it to show to your health care provider. Do not flush blood clots down the toilet without showing your health care provider. °· You urinate more than usual. °· You are dizzy or light-headed. °· You have not breastfed and have not had a menstrual period for 12 weeks after delivery. °· You stopped breastfeeding and have not had a menstrual period for 12 weeks after stopping breastfeeding. °Get help right away if: °· You have: °¨ Pain that does not go away or get better with medicine. °¨ Chest pain. °¨ Difficulty breathing. °¨ Blurred vision or spots in your vision. °¨ Thoughts about hurting yourself or your baby. °¨ New pain in your abdomen or in one of your legs. °¨ A severe headache. °· You faint. °· You bleed from your vagina so  much that you fill two sanitary pads in one hour. °This information is not intended to replace advice given to you by your health care provider. Make sure you discuss any questions you have with your health care provider. °Document Released: 06/05/2002 Document Revised: 01/22/2016 Document Reviewed: 08/18/2015 °[employer]vier Interactive Patient Education © 2017 [employer] ° ° °Breastfeeding °Deciding to breastfeed is one of the best choices you can make for you and your baby. A change in hormones during pregnancy causes your breast tissue to grow and increases the number and size of your milk ducts. These hormones also allow proteins, sugars, and fats from your blood supply to make breast milk in your milk-producing glands. Hormones prevent breast milk from being released before your baby is born as well as prompt milk flow after birth. Once breastfeeding has begun, thoughts of your baby, as well as his or her sucking or crying, can stimulate the release of milk from your milk-producing   glands. °Benefits of breastfeeding °For Your Baby °· Your first milk (colostrum) helps your baby's digestive system function better. °· There are antibodies in your milk that help your baby fight off infections. °· Your baby has a lower incidence of asthma, allergies, and sudden infant death syndrome. °· The nutrients in breast milk are better for your baby than infant formulas and are designed uniquely for your baby’s needs. °· Breast milk improves your baby's brain development. °· Your baby is less likely to develop other conditions, such as childhood obesity, asthma, or type 2 diabetes mellitus. °For You °· Breastfeeding helps to create a very special bond between you and your baby. °· Breastfeeding is convenient. Breast milk is always available at the correct temperature and costs nothing. °· Breastfeeding helps to burn calories and helps you lose the weight gained during pregnancy. °· Breastfeeding makes your uterus contract to  its prepregnancy size faster and slows bleeding (lochia) after you give birth. °· Breastfeeding helps to lower your risk of developing type 2 diabetes mellitus, osteoporosis, and breast or ovarian cancer later in life. °Signs that your baby is hungry °Early Signs of Hunger °· Increased alertness or activity. °· Stretching. °· Movement of the head from side to side. °· Movement of the head and opening of the mouth when the corner of the mouth or cheek is stroked (rooting). °· Increased sucking sounds, smacking lips, cooing, sighing, or squeaking. °· Hand-to-mouth movements. °· Increased sucking of fingers or hands. °Late Signs of Hunger °· Fussing. °· Intermittent crying. °Extreme Signs of Hunger  °Signs of extreme hunger will require calming and consoling before your baby will be able to breastfeed successfully. Do not wait for the following signs of extreme hunger to occur before you initiate breastfeeding: °· Restlessness. °· A loud, strong cry. °· Screaming. °Breastfeeding basics ° Breastfeeding Initiation °· Find a comfortable place to sit or lie down, with your neck and back well supported. °· Place a pillow or rolled up blanket under your baby to bring him or her to the level of your breast (if you are seated). Nursing pillows are specially designed to help support your arms and your baby while you breastfeed. °· Make sure that your baby's abdomen is facing your abdomen. °· Gently massage your breast. With your fingertips, massage from your chest wall toward your nipple in a circular motion. This encourages milk flow. You may need to continue this action during the feeding if your milk flows slowly. °· Support your breast with 4 fingers underneath and your thumb above your nipple. Make sure your fingers are well away from your nipple and your baby’s mouth. °· Stroke your baby's lips gently with your finger or nipple. °· When your baby's mouth is open wide enough, quickly bring your baby to your breast, placing  your entire nipple and as much of the colored area around your nipple (areola) as possible into your baby's mouth. °¨ More areola should be visible above your baby's upper lip than below the lower lip. °¨ Your baby's tongue should be between his or her lower gum and your breast. °· Ensure that your baby's mouth is correctly positioned around your nipple (latched). Your baby's lips should create a seal on your breast and be turned out (everted). °· It is common for your baby to suck about 2-3 minutes in order to start the flow of breast milk. °Latching  °Teaching your baby how to latch on to your breast properly is very important. An improper   latch can cause nipple pain and decreased milk supply for you and poor weight gain in your baby. Also, if your baby is not latched onto your nipple properly, he or she may swallow some air during feeding. This can make your baby fussy. Burping your baby when you switch breasts during the feeding can help to get rid of the air. However, teaching your baby to latch on properly is still the best way to prevent fussiness from swallowing air while breastfeeding. °Signs that your baby has successfully latched on to your nipple: °· Silent tugging or silent sucking, without causing you pain. °· Swallowing heard between every 3-4 sucks. °· Muscle movement above and in front of his or her ears while sucking. °Signs that your baby has not successfully latched on to nipple: °· Sucking sounds or smacking sounds from your baby while breastfeeding. °· Nipple pain. °If you think your baby has not latched on correctly, slip your finger into the corner of your baby’s mouth to break the suction and place it between your baby's gums. Attempt breastfeeding initiation again. °Signs of Successful Breastfeeding  °Signs from your baby: °· A gradual decrease in the number of sucks or complete cessation of sucking. °· Falling asleep. °· Relaxation of his or her body. °· Retention of a small amount of milk  in his or her mouth. °· Letting go of your breast by himself or herself. °Signs from you: °· Breasts that have increased in firmness, weight, and size 1-3 hours after feeding. °· Breasts that are softer immediately after breastfeeding. °· Increased milk volume, as well as a change in milk consistency and color by the fifth day of breastfeeding. °· Nipples that are not sore, cracked, or bleeding. °Signs That Your Baby is Getting Enough Milk °· Wetting at least 1-2 diapers during the first 24 hours after birth. °· Wetting at least 5-6 diapers every 24 hours for the first week after birth. The urine should be clear or pale yellow by 5 days after birth. °· Wetting 6-8 diapers every 24 hours as your baby continues to grow and develop. °· At least 3 stools in a 24-hour period by age 5 days. The stool should be soft and yellow. °· At least 3 stools in a 24-hour period by age 7 days. The stool should be seedy and yellow. °· No loss of weight greater than 10% of birth weight during the first 3 days of age. °· Average weight gain of 4-7 ounces (113-198 g) per week after age 4 days. °· Consistent daily weight gain by age 5 days, without weight loss after the age of 2 weeks. °After a feeding, your baby may spit up a small amount. This is common. °Breastfeeding frequency and duration °Frequent feeding will help you make more milk and can prevent sore nipples and breast engorgement. Breastfeed when you feel the need to reduce the fullness of your breasts or when your baby shows signs of hunger. This is called "breastfeeding on demand." Avoid introducing a pacifier to your baby while you are working to establish breastfeeding (the first 4-6 weeks after your baby is born). After this time you may choose to use a pacifier. Research has shown that pacifier use during the first year of a baby's life decreases the risk of sudden infant death syndrome (SIDS). °Allow your baby to feed on each breast as long as he or she wants. Breastfeed  until your baby is finished feeding. When your baby unlatches or falls asleep while feeding   from the first breast, offer the second breast. Because newborns are often sleepy in the first few weeks of life, you may need to awaken your baby to get him or her to feed. °Breastfeeding times will vary from baby to baby. However, the following rules can serve as a guide to help you ensure that your baby is properly fed: °· Newborns (babies 4 weeks of age or younger) may breastfeed every 1-3 hours. °· Newborns should not go longer than 3 hours during the day or 5 hours during the night without breastfeeding. °· You should breastfeed your baby a minimum of 8 times in a 24-hour period until you begin to introduce solid foods to your baby at around 6 months of age. °Breast milk pumping °Pumping and storing breast milk allows you to ensure that your baby is exclusively fed your breast milk, even at times when you are unable to breastfeed. This is especially important if you are going back to work while you are still breastfeeding or when you are not able to be present during feedings. Your lactation consultant can give you guidelines on how long it is safe to store breast milk. °A breast pump is a machine that allows you to pump milk from your breast into a sterile bottle. The pumped breast milk can then be stored in a refrigerator or freezer. Some breast pumps are operated by hand, while others use electricity. Ask your lactation consultant which type will work best for you. Breast pumps can be purchased, but some hospitals and breastfeeding support groups lease breast pumps on a monthly basis. A lactation consultant can teach you how to hand express breast milk, if you prefer not to use a pump. °Caring for your breasts while you breastfeed °Nipples can become dry, cracked, and sore while breastfeeding. The following recommendations can help keep your breasts moisturized and healthy: °· Avoid using soap on your  nipples. °· Wear a supportive bra. Although not required, special nursing bras and tank tops are designed to allow access to your breasts for breastfeeding without taking off your entire bra or top. Avoid wearing underwire-style bras or extremely tight bras. °· Air dry your nipples for 3-4 minutes after each feeding. °· Use only cotton bra pads to absorb leaked breast milk. Leaking of breast milk between feedings is normal. °· Use lanolin on your nipples after breastfeeding. Lanolin helps to maintain your skin's normal moisture barrier. If you use pure lanolin, you do not need to wash it off before feeding your baby again. Pure lanolin is not toxic to your baby. You may also hand express a few drops of breast milk and gently massage that milk into your nipples and allow the milk to air dry. °In the first few weeks after giving birth, some women experience extremely full breasts (engorgement). Engorgement can make your breasts feel heavy, warm, and tender to the touch. Engorgement peaks within 3-5 days after you give birth. The following recommendations can help ease engorgement: °· Completely empty your breasts while breastfeeding or pumping. You may want to start by applying warm, moist heat (in the shower or with warm water-soaked hand towels) just before feeding or pumping. This increases circulation and helps the milk flow. If your baby does not completely empty your breasts while breastfeeding, pump any extra milk after he or she is finished. °· Wear a snug bra (nursing or regular) or tank top for 1-2 days to signal your body to slightly decrease milk production. °· Apply ice packs to   your breasts, unless this is too uncomfortable for you. °· Make sure that your baby is latched on and positioned properly while breastfeeding. °If engorgement persists after 48 hours of following these recommendations, contact your health care provider or a lactation consultant. °Overall health care recommendations while  breastfeeding °· Eat healthy foods. Alternate between meals and snacks, eating 3 of each per day. Because what you eat affects your breast milk, some of the foods may make your baby more irritable than usual. Avoid eating these foods if you are sure that they are negatively affecting your baby. °· Drink milk, fruit juice, and water to satisfy your thirst (about 10 glasses a day). °· Rest often, relax, and continue to take your prenatal vitamins to prevent fatigue, stress, and anemia. °· Continue breast self-awareness checks. °· Avoid chewing and smoking tobacco. Chemicals from cigarettes that pass into breast milk and exposure to secondhand smoke may harm your baby. °· Avoid alcohol and drug use, including marijuana. °Some medicines that may be harmful to your baby can pass through breast milk. It is important to ask your health care provider before taking any medicine, including all over-the-counter and prescription medicine as well as vitamin and herbal supplements. °It is possible to become pregnant while breastfeeding. If birth control is desired, ask your health care provider about options that will be safe for your baby. °Contact a health care provider if: °· You feel like you want to stop breastfeeding or have become frustrated with breastfeeding. °· You have painful breasts or nipples. °· Your nipples are cracked or bleeding. °· Your breasts are red, tender, or warm. °· You have a swollen area on either breast. °· You have a fever or chills. °· You have nausea or vomiting. °· You have drainage other than breast milk from your nipples. °· Your breasts do not become full before feedings by the fifth day after you give birth. °· You feel sad and depressed. °· Your baby is too sleepy to eat well. °· Your baby is having trouble sleeping. °· Your baby is wetting less than 3 diapers in a 24-hour period. °· Your baby has less than 3 stools in a 24-hour period. °· Your baby's skin or the white part of his or her eyes  becomes yellow. °· Your baby is not gaining weight by 5 days of age. °Get help right away if: °· Your baby is overly tired (lethargic) and does not want to wake up and feed. °· Your baby develops an unexplained fever. °This information is not intended to replace advice given to you by your health care provider. Make sure you discuss any questions you have with your health care provider. °Document Released: 09/13/2005 Document Revised: 02/25/2016 Document Reviewed: 03/07/2013 °[employer]vier Interactive Patient Education © 2017 [employer] ° ° °

## 2016-11-10 ENCOUNTER — Encounter: Payer: Self-pay | Admitting: Obstetrics and Gynecology

## 2016-11-10 ENCOUNTER — Ambulatory Visit (INDEPENDENT_AMBULATORY_CARE_PROVIDER_SITE_OTHER): Payer: Medicaid Other | Admitting: Obstetrics and Gynecology

## 2016-11-10 VITALS — BP 121/73 | HR 83 | Wt 181.6 lb

## 2016-11-10 DIAGNOSIS — Z30013 Encounter for initial prescription of injectable contraceptive: Secondary | ICD-10-CM

## 2016-11-10 DIAGNOSIS — Z309 Encounter for contraceptive management, unspecified: Secondary | ICD-10-CM | POA: Insufficient documentation

## 2016-11-10 DIAGNOSIS — R87610 Atypical squamous cells of undetermined significance on cytologic smear of cervix (ASC-US): Secondary | ICD-10-CM

## 2016-11-10 DIAGNOSIS — R87810 Cervical high risk human papillomavirus (HPV) DNA test positive: Principal | ICD-10-CM

## 2016-11-10 MED ORDER — MEDROXYPROGESTERONE ACETATE 150 MG/ML IM SUSP: 150.0000 mg | INTRAMUSCULAR | 0 refills | Status: DC

## 2016-11-10 NOTE — Progress Notes (Signed)
Post Partum Exam  Kim [name] is a 26 y.o. [phone] female who presents for a postpartum visit. She is 4 weeks postpartum following a c/s. I have fully reviewed the prenatal and intrapartum course. The delivery was at [id number] gestational weeks.  Anesthesia: epidural. Postpartum course has been unremarkable. Baby's course has been unremarkable. Baby is feeding by breast. Bleeding changing a maxi pad 2 times a day. Bowel function is normal. Bladder function is normal. Patient is not sexually active. Contraception method is Depo-Provera injections. Postpartum depression screening: neg EPDS - 1  The following portions of the patient's history were reviewed and updated as appropriate: allergies, current medications, past family history, past medical history, past social history and past surgical history.  Review of Systems Pertinent items are noted in HPI.    Objective:    BP 116/78 mmHg  Pulse 78  Resp 16  Ht 5\' 5"  (1.651 m)  Wt 211 lb (95.709 kg)  BMI 35.11 kg/m2  Breastfeeding? Yes  General:  alert   Breasts:  not evaluated  Lungs: clear to auscultation bilaterally  Heart:  regular rate and rhythm, S1, S2 normal, no murmur, click, rub or gallop  Abdomen: soft, non-tender; bowel sounds normal; no masses,  no organomegaly, incision well healed   Vulva:  not evaluated  Vagina: not evaluated  Cervix:  not evaluated  Corpus: not examined  Adnexa:  not evaluated  Rectal Exam: Not performed.        Assessment:    Normal postpartum exam.   Contraceptive management  Plan:   1. Contraception: Depo-Provera injections 2. Return to normal activities 3. Follow up in: 1 year or as needed.

## 2016-11-10 NOTE — Patient Instructions (Signed)
Hormonal Contraception Information Introduction Estrogen and progesterone (progestin) are hormones used in many forms of birth control (contraception). These two hormones make up most hormonal contraceptives. Hormonal contraceptives use either:  A combination of estrogen hormone and progesterone hormone in one of these forms:  Pill. Pills come in various combinations of active hormone pills and nonhormonal pills. Different combinations of pills may give you a period once a month, once every 3 months, or no period at all. It is important to take the pills the same time each day.  Patch. The patch is placed on the lower abdomen every week for 3 weeks. On the fourth week, the patch is not placed.  Vaginal ring. The ring is placed in the vagina and left there for 3 weeks. It is then removed for 1 week.  Progesterone alone in one of these forms:  Pill. Hormone pills are taken every day of the cycle.  Intrauterine device (IUD). The IUD is inserted during a menstrual period and removed or replaced every 5 years or sooner.  Implant. Plastic rods are placed under the skin of the upper arm. They are removed or replaced every 3 years or sooner.  Injection. The injection is given once every 90 days. Pregnancy can still occur with any of these hormonal contraceptive methods. If you have any suspicion that you might be pregnant, take a pregnancy test and talk to your health care provider. Estrogen and progesterone contraceptives Estrogen and progesterone contraceptives can prevent pregnancy by:  Stopping the release of an egg (ovulation).  Thickening the mucus of the cervix, making it difficult for sperm to enter the uterus.  Changing the lining of the uterus. This change makes it more difficult for an egg to implant. Progesterone contraceptives Progesterone-only contraceptives can prevent pregnancy by:  Blocking ovulation. This occurs in many women, but some women will continue to  ovulate.  Preventing the entry of sperm into the uterus by keeping the cervical mucus thick and sticky.  Changing the lining of the uterus. This change makes it more difficult for an egg to implant. Side effects Talk to your health care provider about what side effects may affect you. If you develop persistent side effects or if the effects are severe, talk to your health care provider.  Estrogen. Side effects from estrogen occur more often in the first 2-3 months. They include:  Progesterone. Side effects of progesterone can vary. They include: Questions to ask This information is not intended to replace advice given to you by your health care provider. Make sure you discuss any questions you have with your health care provider. Document Released: 10/03/2007 Document Revised: 06/16/2016 Document Reviewed: 02/25/2013  2017 Elsevier  

## 2016-11-30 IMAGING — US US OB TRANSVAGINAL
1 series · 15 of 28 positions shown · non-contrast
Comparison: 02/09/2016.

CLINICAL DATA: Pregnancy.

EXAM:
TRANSVAGINAL OB ULTRASOUND
TECHNIQUE: Transvaginal ultrasound was performed for complete evaluation of the
gestation as well as the maternal uterus, adnexal regions, and
pelvic cul-de-sac.

[Series 1: us ob transvaginal · 43 acquisitions, 15 frames shown]
[im 1/43]
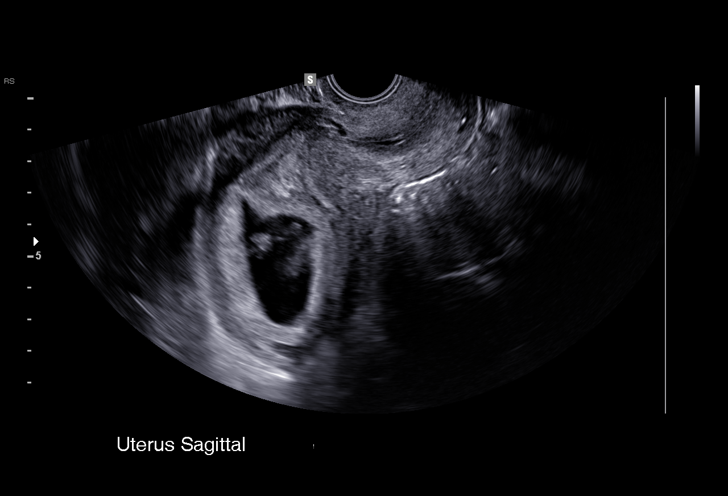
[im 4/43]
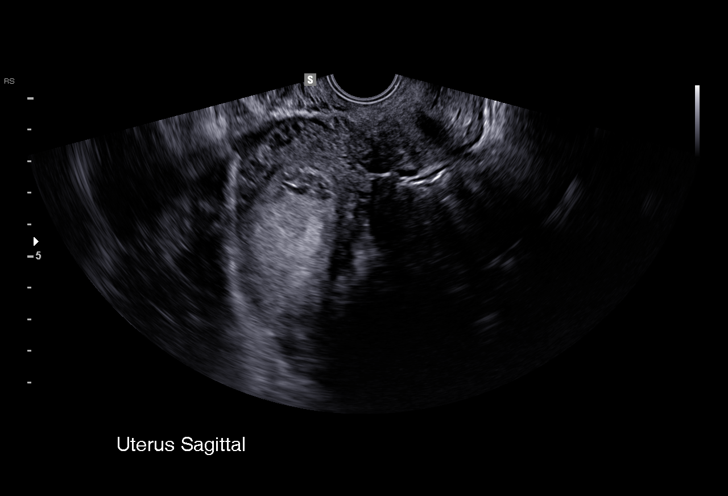
[im 7/43]
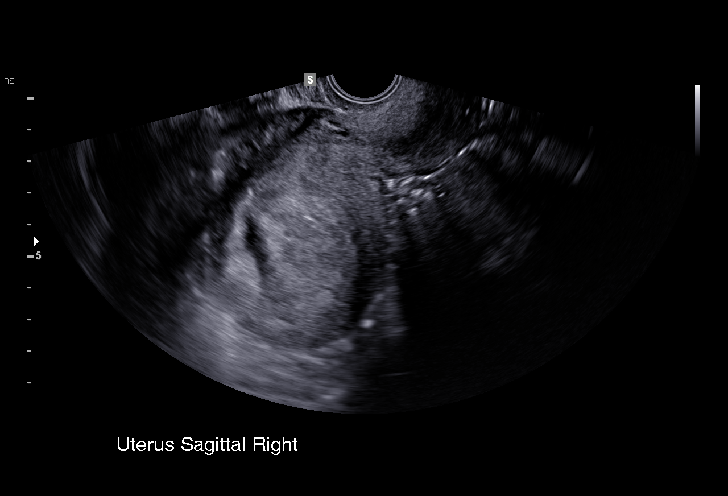
[im 10/43]
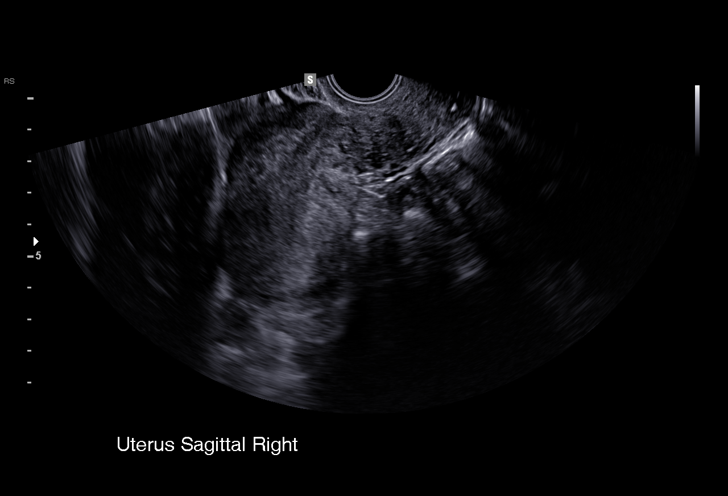
[im 13/43]
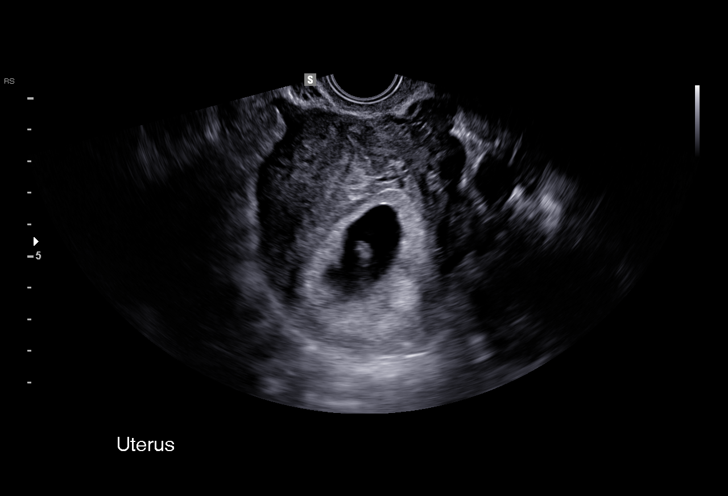
[im 16/43]
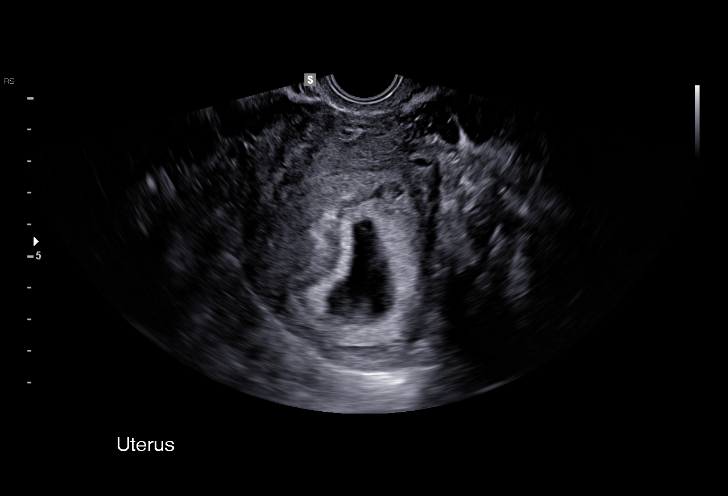
[im 19/43]
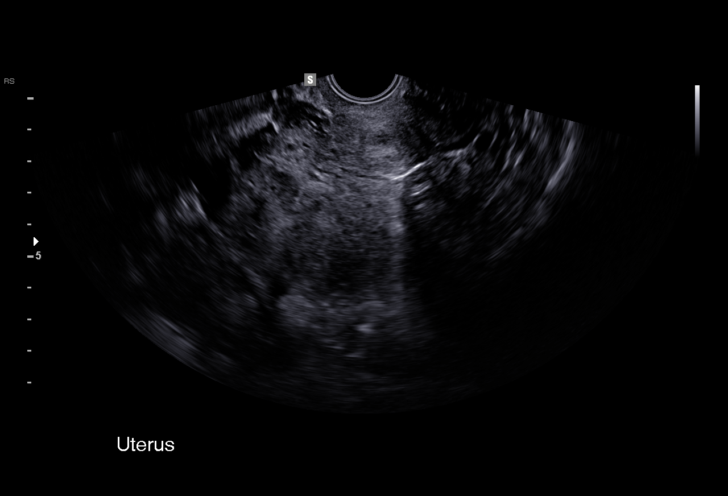
[im 22/43]
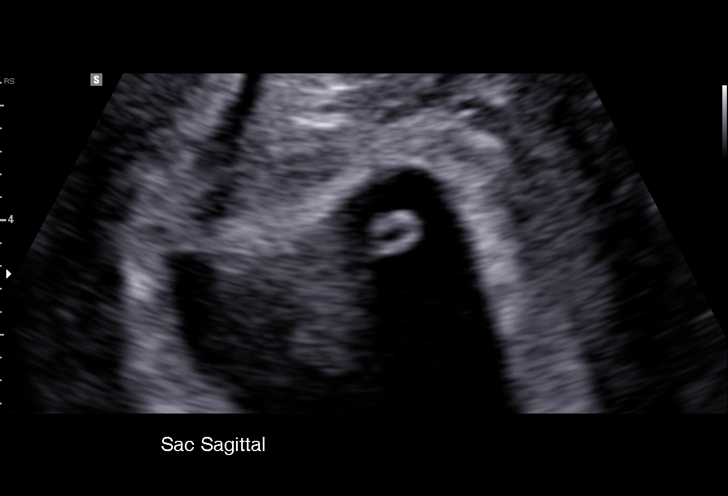
[im 24/43]
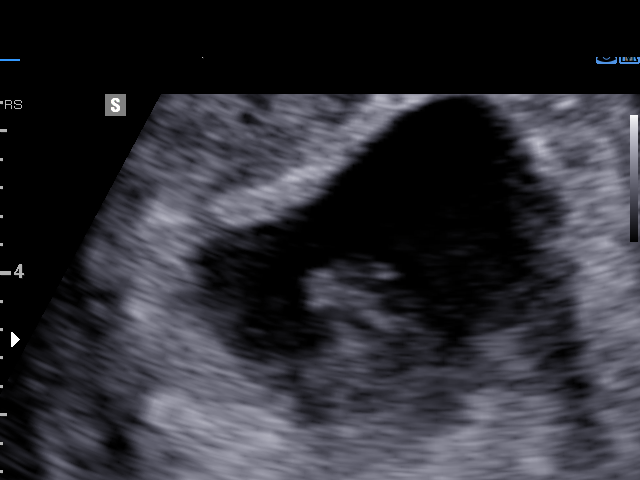
[im 27/43]
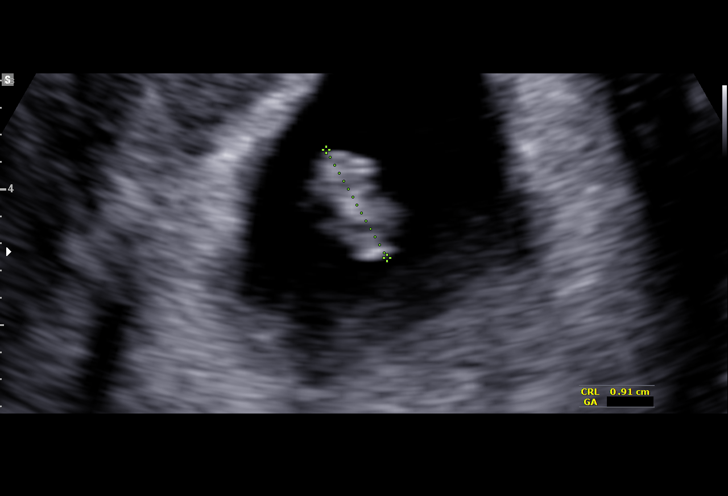
[im 30/43]
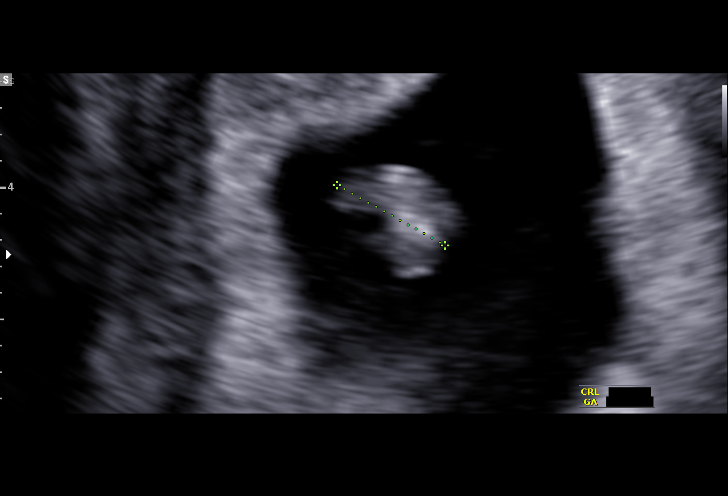
[im 33/43]
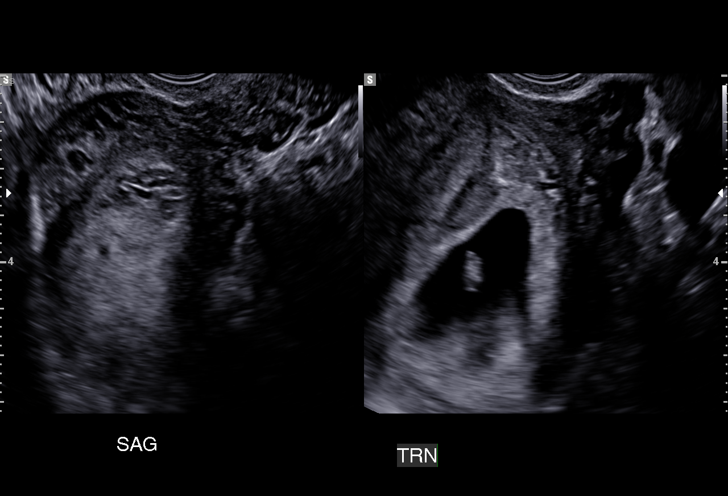
[im 36/43]
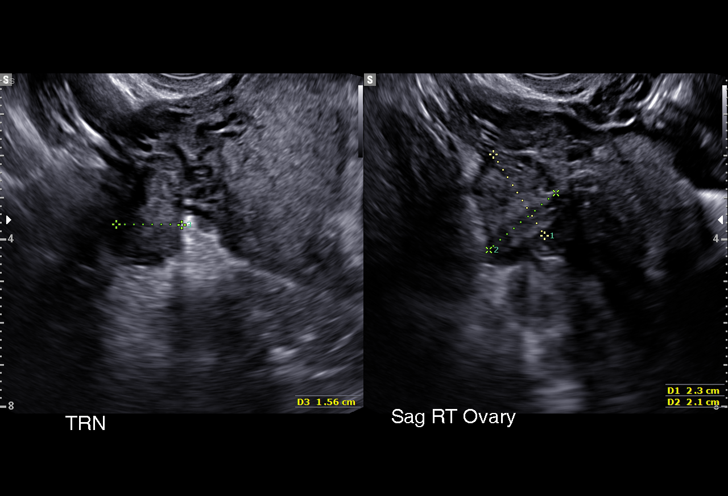
[im 39/43]
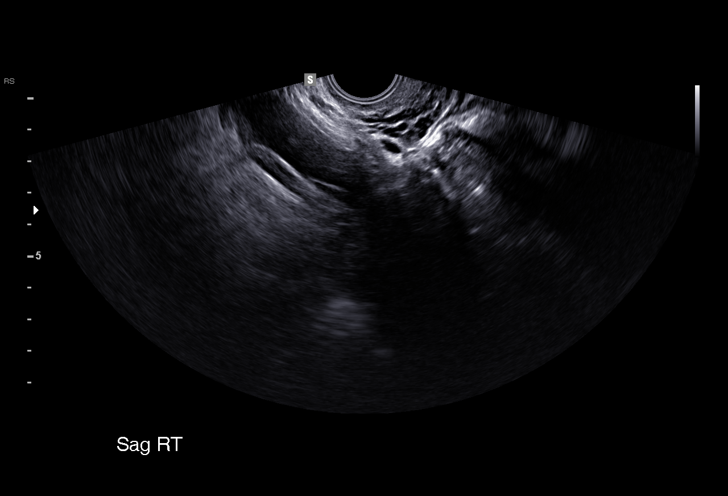
[im 43/43]
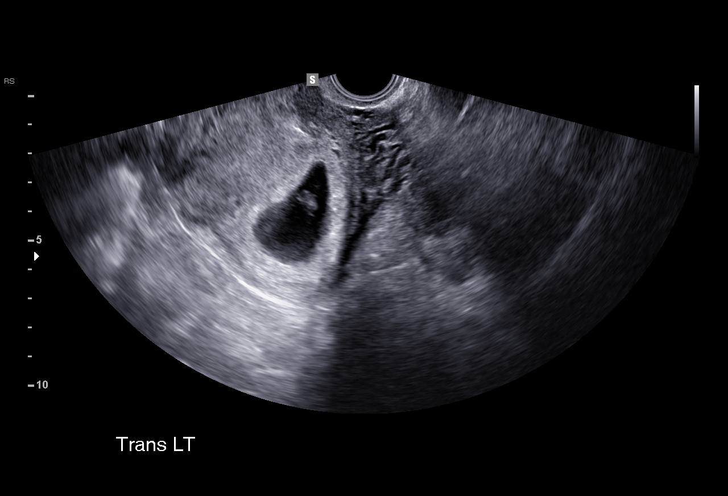

[15 of 28 positions shown; findings below may reference images not displayed]

FINDINGS: Intrauterine gestational sac: Single

Yolk sac:  Present

Embryo:  Pressed

Cardiac Activity: Present

Heart Rate: 144 bpm

CRL:   0.9 cm 7 w 0 d                  US EDC: 10/14/2016

Subchorionic hemorrhage: Small subchronic hemorrhage measuring 1.9 x
1.2 x 1.6 cm

Maternal uterus/adnexae: Right ovary is unremarkable. Could not
visualize left ovary due to overlying bowel. No free pelvic fluid.
IMPRESSION: Single viable intrauterine pregnancy at 7 weeks 0 days. Small
subchorionic hemorrhage is present.

## 2016-12-06 IMAGING — US US OB TRANSVAGINAL
1 series · 15 of 28 positions shown · non-contrast
Comparison: Ob ultrasound 02/26/2016.

CLINICAL DATA: 24-year-old female pregnant patient with vaginal
bleeding.

EXAM:
TRANSVAGINAL OB ULTRASOUND
TECHNIQUE: Transvaginal ultrasound was performed for complete evaluation of the
gestation as well as the maternal uterus, adnexal regions, and
pelvic cul-de-sac.

[Series 1: us ob transvaginal · 15 of 29 slices shown]
[im 1/29]
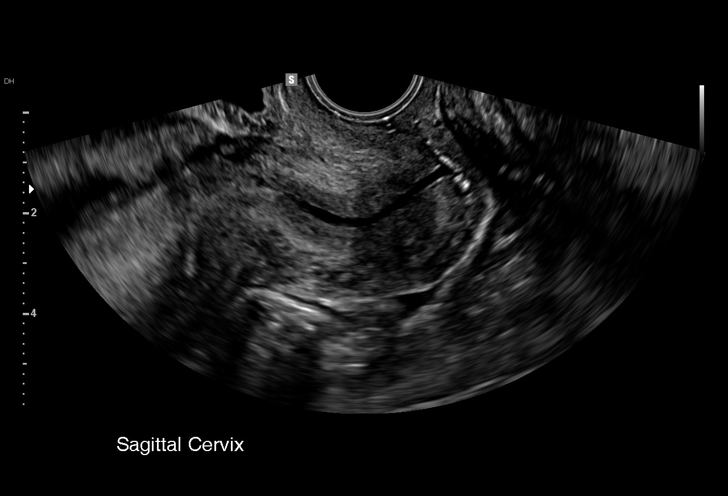
[im 3/29]
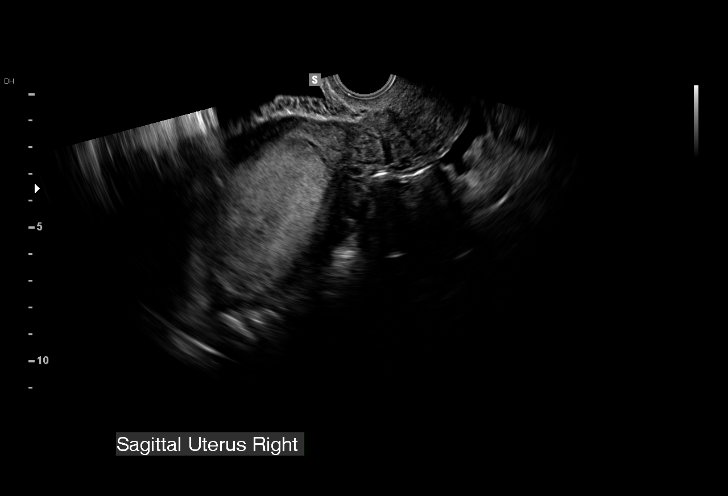
[im 5/29]
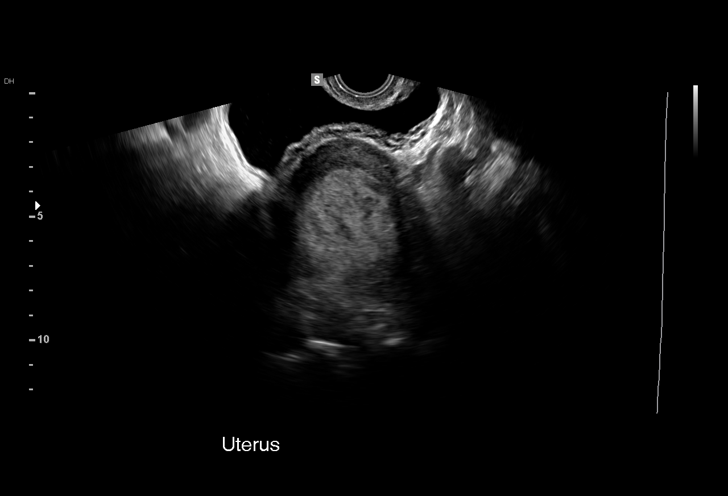
[im 7/29]
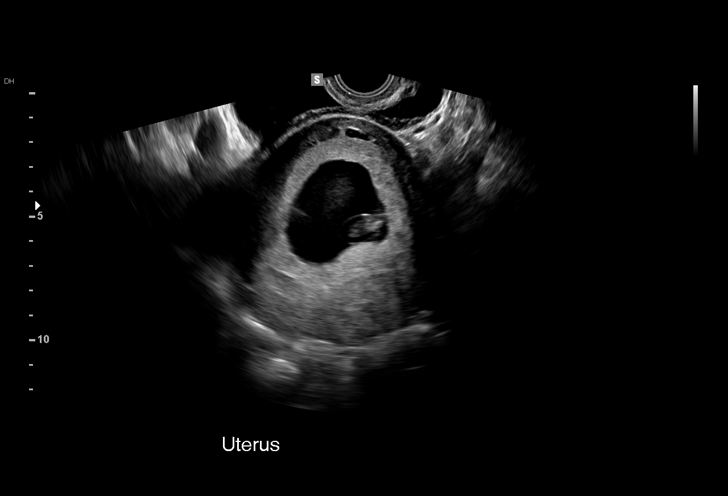
[im 9/29]
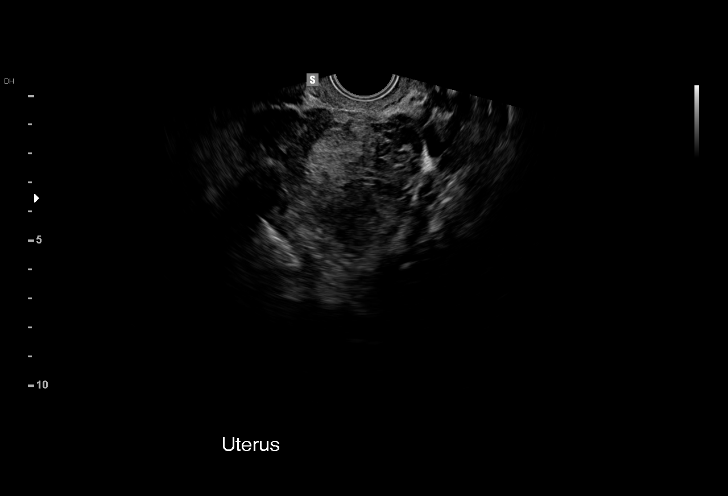
[im 11/29]
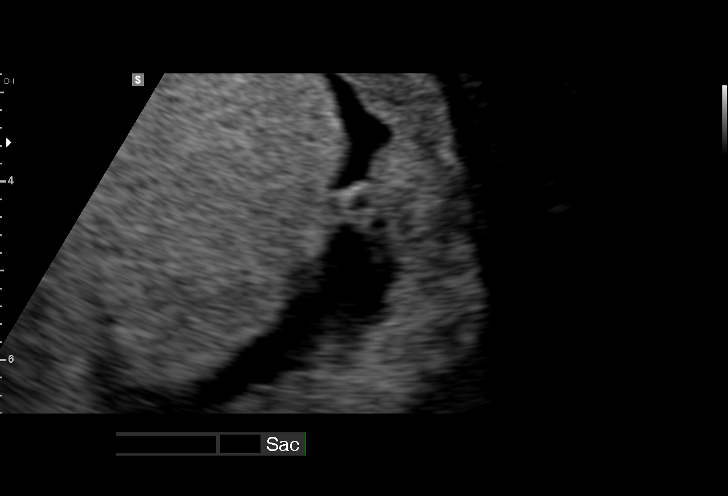
[im 13/29]
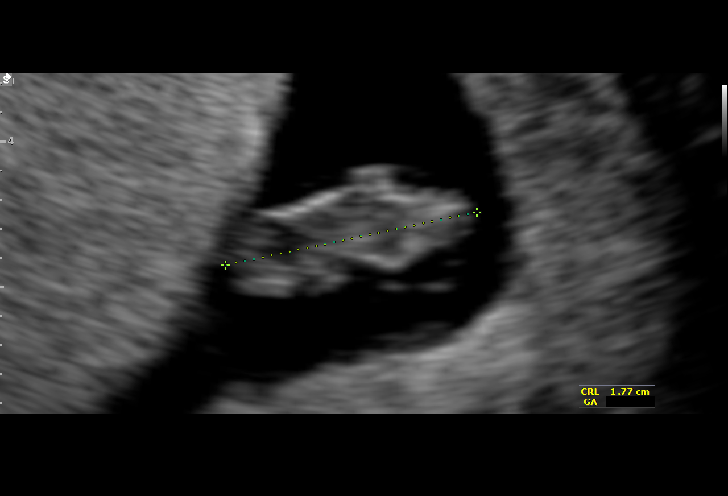
[im 15/29]
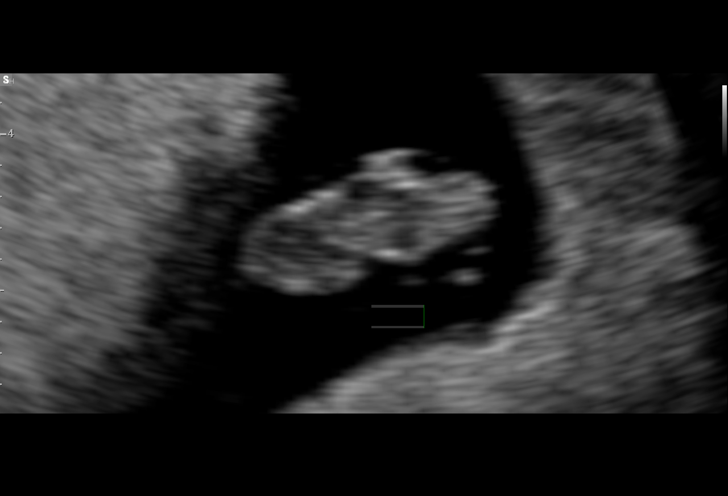
[im 16/29]
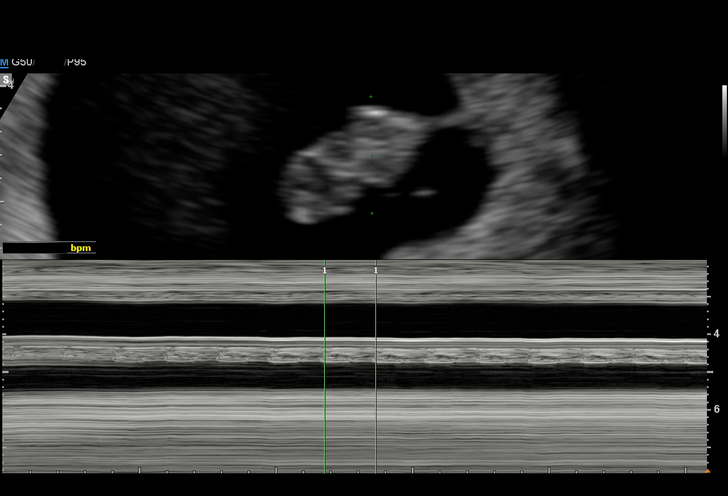
[im 18/29]
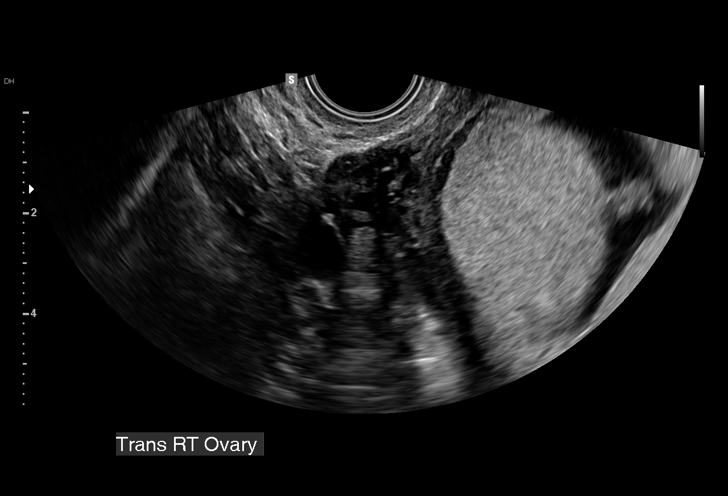
[im 20/29]
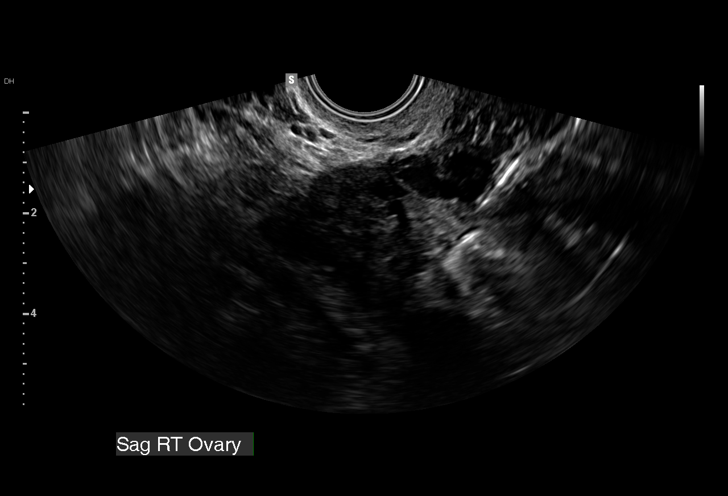
[im 22/29]
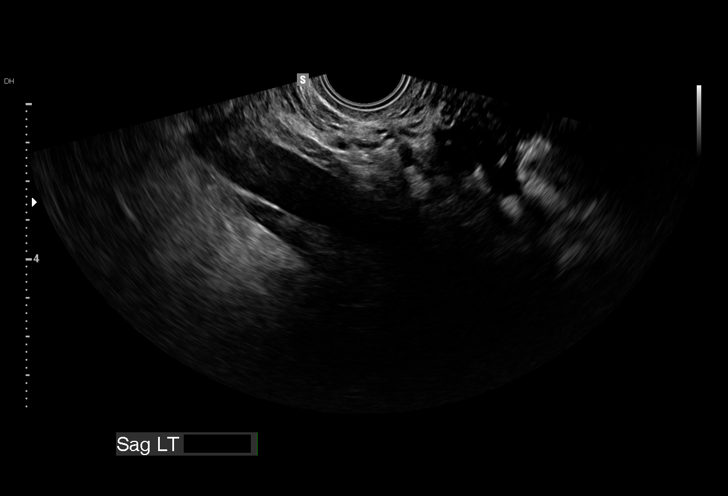
[im 24/29]
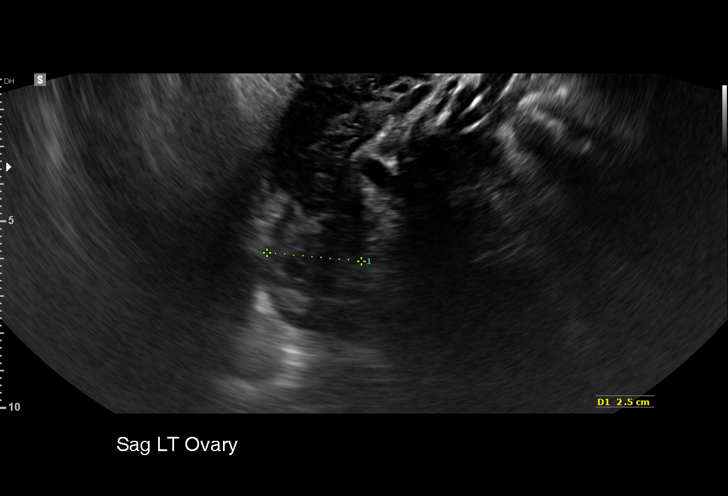
[im 26/29]
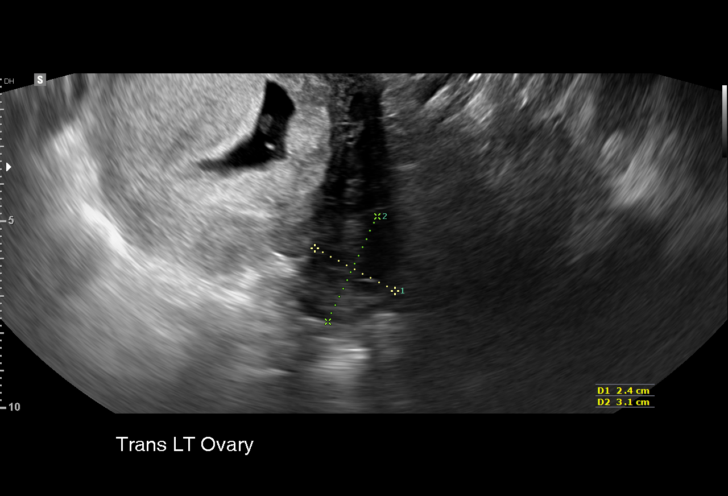
[im 29/29]
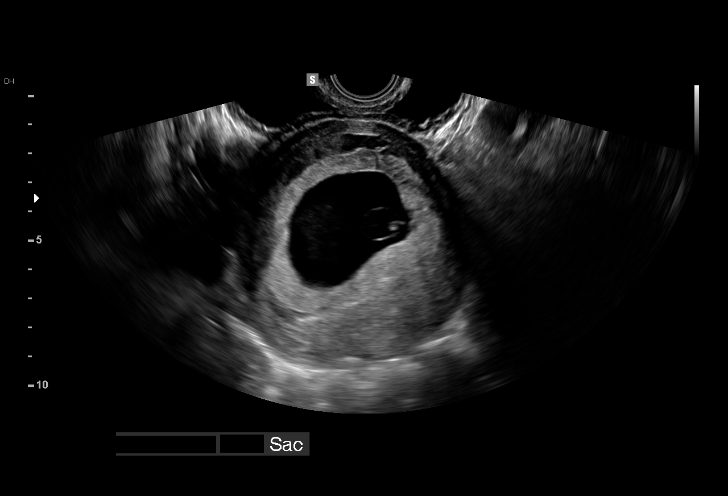

[15 of 28 positions shown; findings below may reference images not displayed]

FINDINGS: Intrauterine gestational sac: Single and ovoid shaped.

Yolk sac:  Present.

Embryo:  Present.

Cardiac Activity: Present.

Heart Rate: 160 bpm

CRL:   18  mm   8 w 2 d                  US EDC: 10/11/2016

Subchorionic hemorrhage:  None visualized.

Maternal uterus/adnexae: Bilateral ovaries are normal in appearance.
Trace volume of free fluid in the cul-de-sac.
IMPRESSION: 1. Single viable IUP with estimated gestational age of 8 weeks and 2
days and normal fetal heart rate of 160 beats per minute. No acute
findings.

## 2017-02-18 ENCOUNTER — Encounter: Payer: Self-pay | Admitting: *Deleted

## 2017-04-11 NOTE — Addendum Note (Signed)
Addendum  created 04/11/17 1728 by Kelina Beauchamp, MD   Sign clinical note    

## 2017-04-18 ENCOUNTER — Emergency Department (HOSPITAL_COMMUNITY)
Admission: EM | Admit: 2017-04-18 | Discharge: 2017-04-18 | Disposition: A | Discharge status: Home / Self Care | Payer: Medicaid Other | Attending: Emergency Medicine | Admitting: Emergency Medicine

## 2017-04-18 ENCOUNTER — Encounter (HOSPITAL_COMMUNITY): Payer: Self-pay | Admitting: Emergency Medicine

## 2017-04-18 DIAGNOSIS — Y999 Unspecified external cause status: Secondary | ICD-10-CM | POA: Insufficient documentation

## 2017-04-18 DIAGNOSIS — Y929 Unspecified place or not applicable: Secondary | ICD-10-CM | POA: Insufficient documentation

## 2017-04-18 DIAGNOSIS — T148XXA Other injury of unspecified body region, initial encounter: Secondary | ICD-10-CM

## 2017-04-18 DIAGNOSIS — Y9389 Activity, other specified: Secondary | ICD-10-CM | POA: Diagnosis not present

## 2017-04-18 DIAGNOSIS — J45909 Unspecified asthma, uncomplicated: Secondary | ICD-10-CM | POA: Insufficient documentation

## 2017-04-18 DIAGNOSIS — S29012A Strain of muscle and tendon of back wall of thorax, initial encounter: Secondary | ICD-10-CM | POA: Insufficient documentation

## 2017-04-18 DIAGNOSIS — M546 Pain in thoracic spine: Secondary | ICD-10-CM

## 2017-04-18 DIAGNOSIS — X500XXA Overexertion from strenuous movement or load, initial encounter: Secondary | ICD-10-CM | POA: Diagnosis not present

## 2017-04-18 DIAGNOSIS — M549 Dorsalgia, unspecified: Secondary | ICD-10-CM | POA: Diagnosis present

## 2017-04-18 MED ORDER — METHOCARBAMOL 500 MG PO TABS: 500.0000 mg | ORAL_TABLET | Freq: Two times a day (BID) | ORAL | 0 refills | Status: DC

## 2017-04-18 MED ORDER — METHOCARBAMOL 500 MG PO TABS
500.0000 mg | ORAL_TABLET | Freq: Once | ORAL | Status: AC
  Administered 2017-04-18: 500 mg via ORAL
  Filled 2017-04-18: qty 1

## 2017-04-18 NOTE — Discharge Instructions (Signed)
You can take Tylenol or Ibuprofen as directed for pain.  Take the prescribed medication as directed.   As we discussed, he can apply ice today and tomorrow. After that you need to apply heat to the affected area.  Make sure you're doing stretches and moving set area does not become stiff.  Return the emergency  For any worsening pain, fever, difficulty talking, numbness/weakness of her arms or legs, urinary or bowel accidents or any other worsening or concerning symptoms.  If you do not have a primary care doctor you see regularly, please you the list below. Please call them to arrange for follow-up.    No Primary Care Doctor Call [facility]  [phone] Other agencies that provide inexpensive medical care    [facility]  [phone]    [facility]  [phone]    [facility]  [phone]    [facility]  [phone]    [facility]  [phone]    [facility]  [phone]

## 2017-04-18 NOTE — ED Provider Notes (Signed)
[facility] Provider Note   CSN: [id number] Arrival date & time: 04/18/17  1416  By signing my name below, I, Diona BrownerJennifer Gorman, attest that this documentation has been prepared under the direction and in the presence of Graciella FreerLindsey Layden, PA-C. Electronically Signed: Diona BrownerJennifer Gorman, ED Scribe. 04/18/17. 4:44 PM.  History   Chief Complaint Chief Complaint  Patient presents with  . Back Pain    HPI Kim [name] is a 26 y.o. female who presents to the Emergency Department complaining of Sided back pain that has been going on for one week. Patient reports initially when pain began she had been lifting heavy objects while moving. 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 She has been able to ambulate without any difficulty but notes that her pain is exacerbated with ambulation. She has been taking Tylenol with no improvement of pain. She has just intermittent use a heating pad and no improvement. No other alleviating or aggravating factors noted. She denies any falls, trauma, injuries. Denies fevers, weight loss, numbness/weakness of upper and lower extremities, bowel/bladder incontinence, saddle anesthesia, history of back surgery, history of IVDA.    The history is provided by the patient. No language interpreter was used.    Past Medical History:  Diagnosis Date  . Anxiety   . Asthma   . Headache   . History of chlamydia   . Infection    UTI  . Pregnancy induced hypertension     Patient Active Problem List   Diagnosis Date Noted  . Postpartum examination following cesarean delivery 11/10/2016  . Contraceptive management 11/10/2016  . Multiple body piercings 10/06/2016  . ASCUS with positive high risk HPV cervical 04/23/2016  . History of cesarean delivery 04/15/2016    Past Surgical History:  Procedure Laterality Date  . CESAREAN SECTION    . CESAREAN SECTION N/A 10/10/2016   Procedure: CESAREAN SECTION;  Surgeon: Lazaro ArmsLuther H Eure, MD;   [facility];  Service: Obstetrics;  Laterality: N/A;    OB History    Gravida Para Term Preterm AB Living   4 2 2   2 2    SAB TAB Ectopic Multiple Live Births     2   0 2      Obstetric Comments   EAB x 2 (one in clinic procedure and one medical). Scheduled c-section b/c they said her pelvis was too long and narrow. Pt states she never went into labor.        Home Medications    Prior to Admission medications   Medication Sig Start Date End Date Taking? Authorizing Provider  albuterol (PROVENTIL HFA;VENTOLIN HFA) 108 (90 BASE) MCG/ACT inhaler Inhale 2 puffs into the lungs every 6 (six) hours as needed for wheezing or shortness of breath. Patient not taking: Reported on 11/10/2016 06/29/15   Linwood DibblesKnapp, Jon, MD  medroxyPROGESTERone (DEPO-PROVERA) 150 MG/ML injection Inject 1 mL (150 mg total) into the muscle every 3 (three) months. 11/10/16   Hermina StaggersErvin, Michael L, MD  methocarbamol (ROBAXIN) 500 MG tablet Take 1 tablet (500 mg total) by mouth 2 (two) times daily. 04/18/17   Maxwell CaulLayden, Lindsey A, PA-C  Prenatal MV-Min-FA-Omega-3 (PRENATAL GUMMIES/DHA & FA) 0.4-32.5 MG CHEW Chew 2 each by mouth daily.    [provider]    Family History Family History  Problem Relation Age of Onset  . Hypertension Mother   . Cancer Maternal Grandmother   . Drug abuse Father        died  from overdose  . Stroke Neg Hx     Social History Social History  Substance Use Topics  . Smoking status: Never Smoker  . Smokeless tobacco: Never Used  . Alcohol use Yes     Comment: not while preg     Allergies   Patient has no known allergies.   Review of Systems Review of Systems  Constitutional: Negative for fever.  Genitourinary: Negative for dysuria and hematuria.  Musculoskeletal: Positive for back pain.  Skin: Negative for rash.  Neurological: Negative for numbness.     Physical Exam Updated Vital Signs BP 121/88 (BP Location: Left Arm)   Pulse 71   Temp 98.7 F (37.1 C)  (Oral)   Resp 16   SpO2 100%   Physical Exam  Constitutional: She is oriented to person, place, and time. She appears well-developed and well-nourished.  Sitting comfortably on examination table  HENT:  Head: Normocephalic and atraumatic.  Eyes: Conjunctivae and EOM are normal. Right eye exhibits no discharge. Left eye exhibits no discharge. No scleral icterus.  Neck: Full passive range of motion without pain.  Full flexion/extension and lateral movement of neck fully intact. No bony midline tenderness. No deformities or crepitus.   Pulmonary/Chest: Effort normal.  Abdominal: There is no CVA tenderness.  No CVA tenderness bilaterally.  Musculoskeletal: She exhibits tenderness. She exhibits no deformity.       Arms: No C,T, or L-spine midline tenderness. No deformities or crepitus. Tenderness to palpation to the left parapsinal muscles of the thoracic region with overlying. No overlying warmth, erythema or ecchymosis.    Neurological: She is alert and oriented to person, place, and time. GCS eye subscore is 4. GCS verbal subscore is 5. GCS motor subscore is 6.  Follows commands, Moves all extremities  5/5 strength to BUE and BLE  Sensation intact throughout all major nerve distributions.   Skin: Skin is warm and dry.  Psychiatric: She has a normal mood and affect. Her speech is normal and behavior is normal.  Nursing note and vitals reviewed.    ED Treatments / Results  DIAGNOSTIC STUDIES: Oxygen Saturation is 100% on RA, normal by my interpretation.   COORDINATION OF CARE: 4:44 PM-Discussed next steps with pt which includes taking ibuprofen for pain, a muscle relaxer, using ice for a few days, than switching to heat. Pt is to follow up with a PCP. Pt verbalized understanding and is agreeable with the plan.   Labs (all labs ordered are listed, but only abnormal results are displayed) Labs Reviewed - No data to display  EKG  EKG Interpretation None       Radiology No  results found.  Procedures Procedures (including critical care time)  Medications Ordered in ED Medications  methocarbamol (ROBAXIN) tablet 500 mg (500 mg Oral Given 04/18/17 1705)     Initial Impression / Assessment and Plan / ED Course  I have reviewed the triage vital signs and the nursing notes.  Pertinent labs & imaging results that were available during my care of the patient were reviewed by me and considered in my medical decision making (see chart for details).     26 year old female who presents with left sided back pain 1 week. Patient has been lifting heavy objects since she has been moving. 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 Patient is afebrile, non-toxic appearing, sitting comfortably on examination table. Vital signs reviewed and stable. No red flag symptoms. No neuro deficits on exam. History  physical exam are consistent muscular strain, likely result of recent heavy lifting. History/physical exam are not concerning for cauda equina or spinal abscess. Will plan to treat symptomatically. Given absence of trauma, red flag symptoms and reassuring physical exam, no indications for imaging at this time. Patient updated on plan. Provided patient with a list of clinic resources to use if he does not have a PCP. Instructed to call them today to arrange follow-up in the next 24-48 hours. Return precautions discussed. Patient expresses understanding and agreement to plan.    Final Clinical Impressions(s) / ED Diagnoses   Final diagnoses:  Muscle strain  Acute left-sided thoracic back pain    New Prescriptions New Prescriptions   METHOCARBAMOL (ROBAXIN) 500 MG TABLET    Take 1 tablet (500 mg total) by mouth 2 (two) times daily.   I personally performed the services described in this documentation, which was scribed in my presence. The recorded information has been reviewed and is accurate.     Maxwell Caul, PA-C 04/18/17 1724      Tegeler, Canary Brim, MD 04/19/17 1135

## 2017-04-18 NOTE — ED Triage Notes (Signed)
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 Ambulatory. No bowel or bladder incontinence.

## 2017-05-14 IMAGING — US US OB TRANSVAGINAL
1 series · 13 of 28 positions shown · non-contrast
Comparison: None.

CLINICAL DATA: Abdominal discomfort, spotting affecting pregnancy
in first trimester cramping ; beta HCG = 3533

EXAM:
OBSTETRIC <14 WK US AND TRANSVAGINAL OB US
TECHNIQUE: Both transabdominal and transvaginal ultrasound examinations were
performed for complete evaluation of the gestation as well as the
maternal uterus, adnexal regions, and pelvic cul-de-sac.
Transvaginal technique was performed to assess early pregnancy.

[Series 1: us ob transvaginal · 0.19mm/px · 61 acquisitions, 13 frames shown]
[im 3/61]
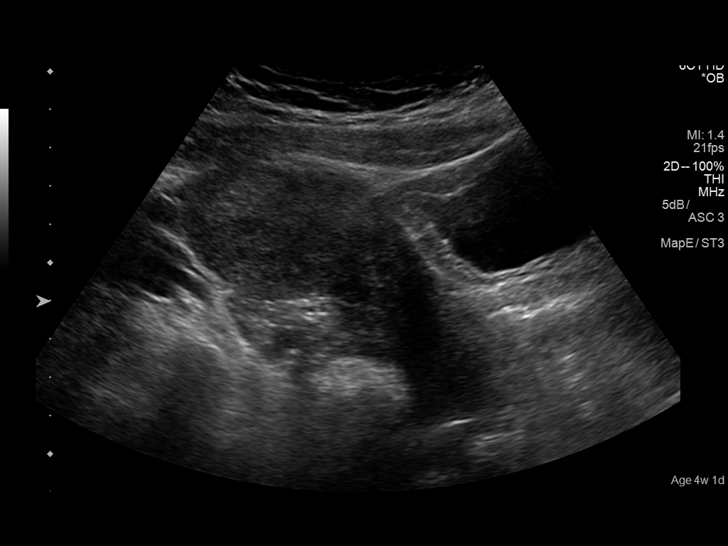
[im 7/61]
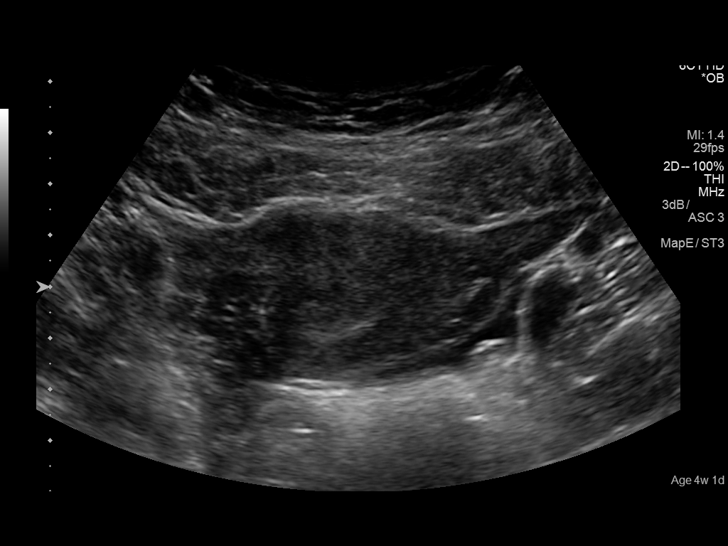
[im 12/61]
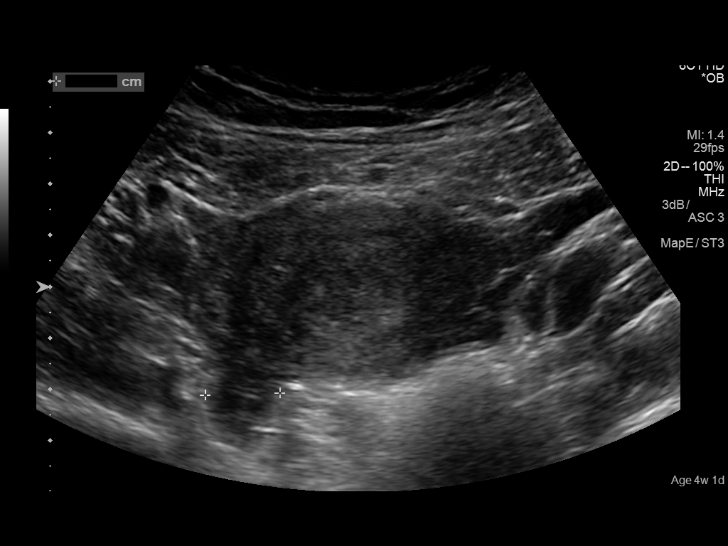
[im 16/61]
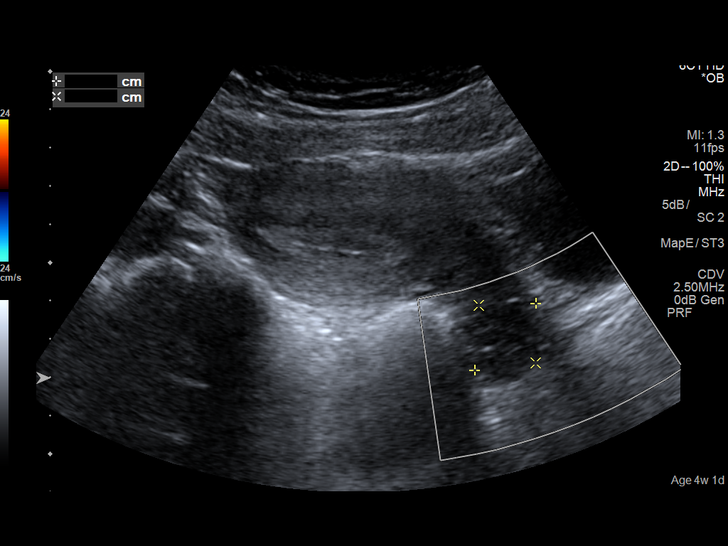
[im 21/61]
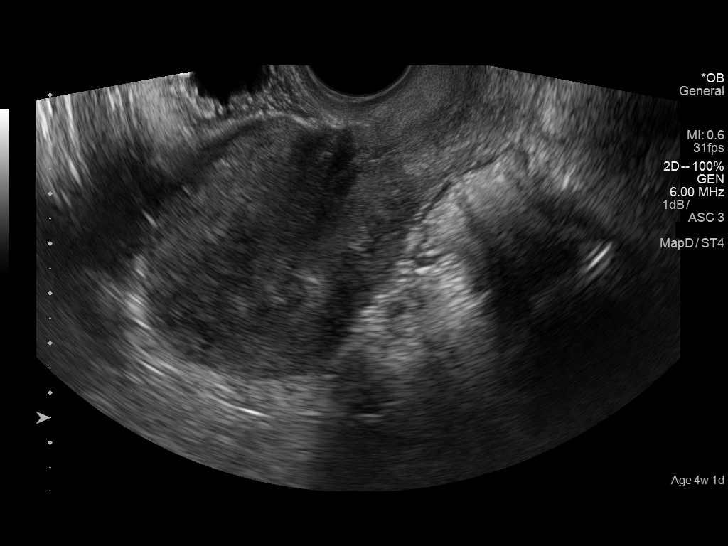
[im 25/61]
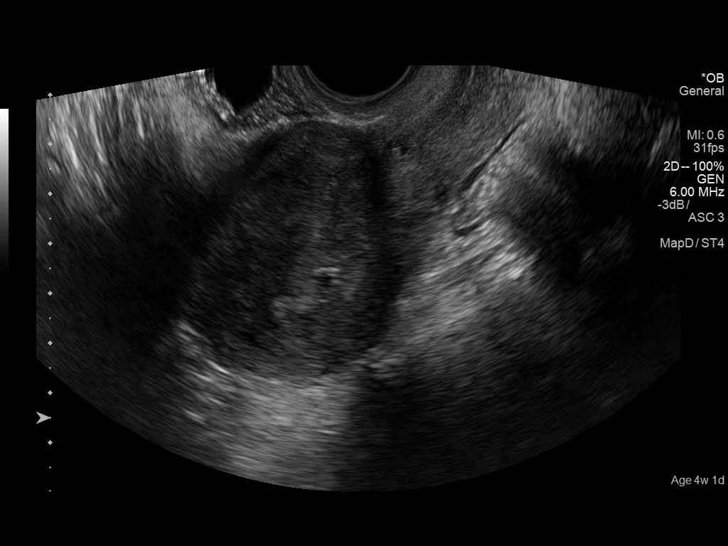
[im 32/61]
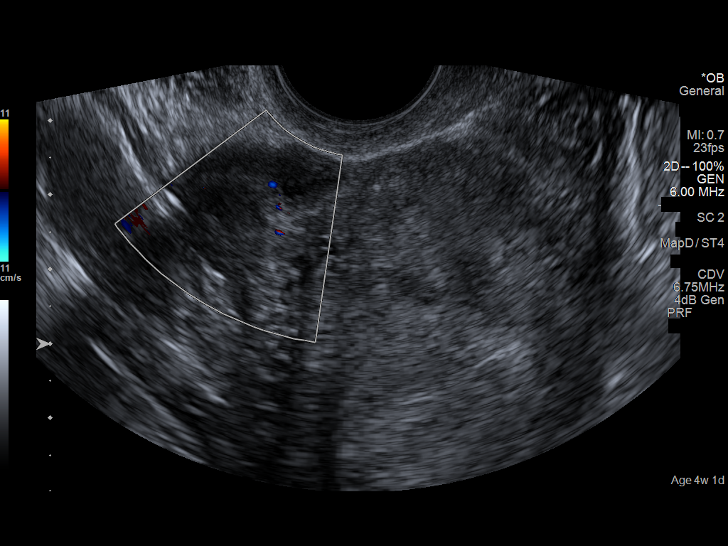
[im 36/61]
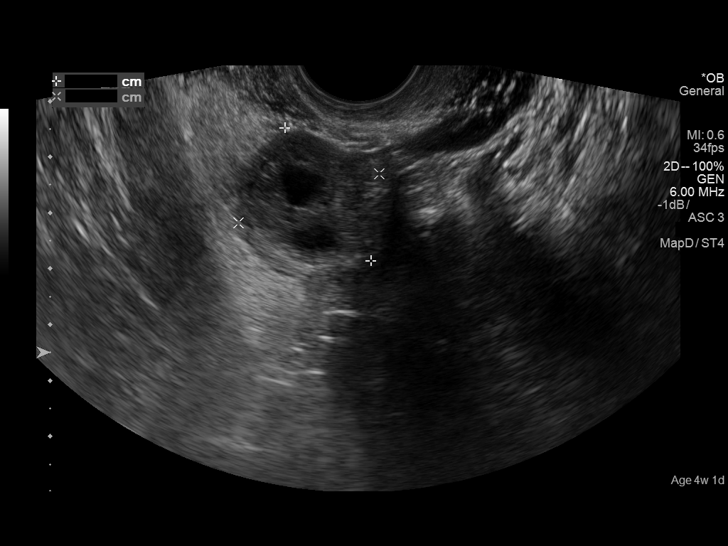
[im 41/61]
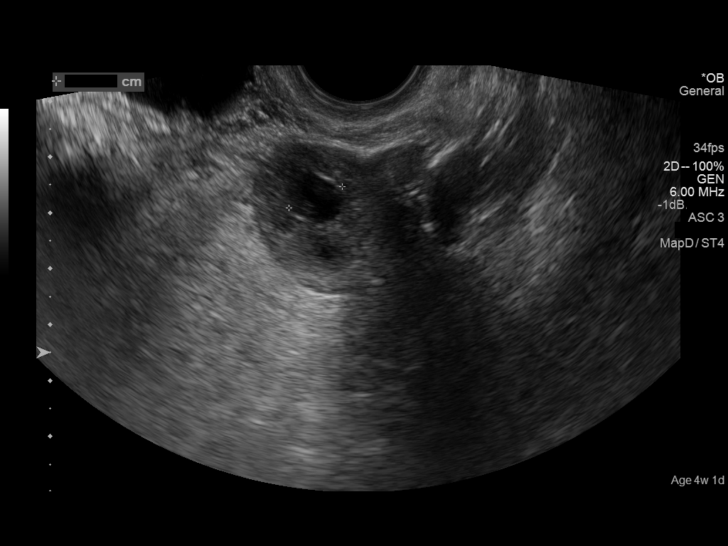
[im 45/61]
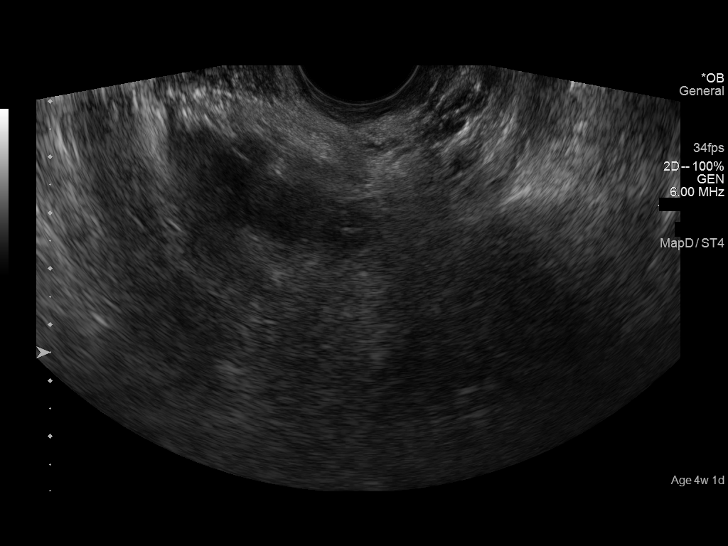
[im 49/61]
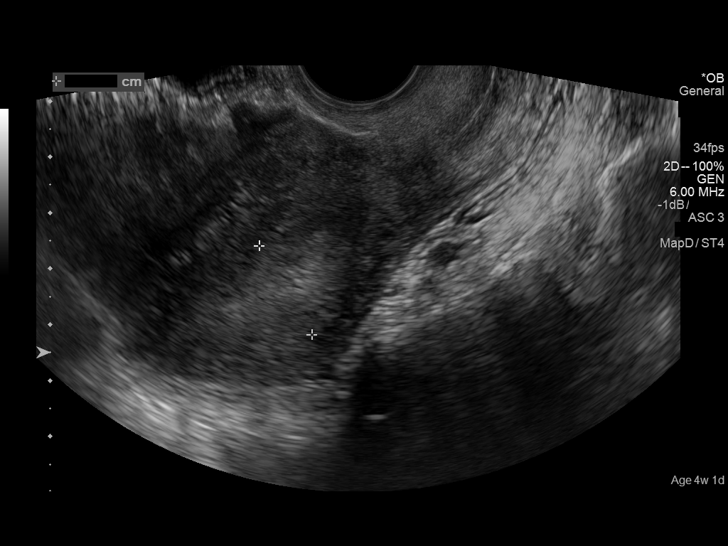
[im 54/61]
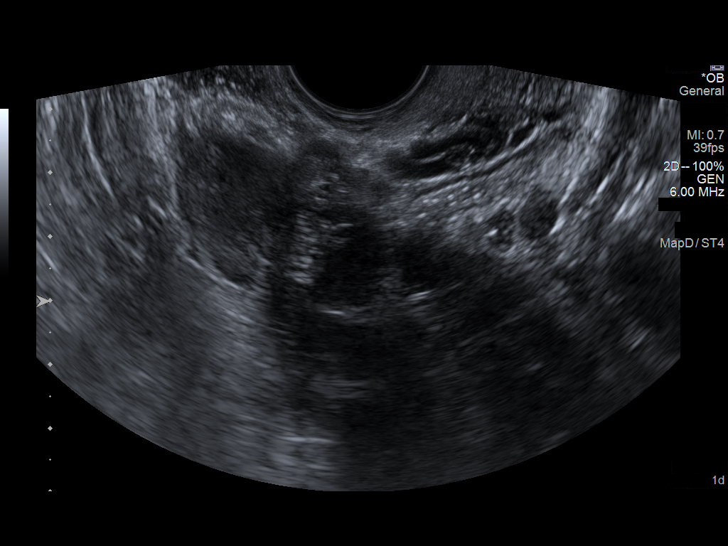
[im 58/61]
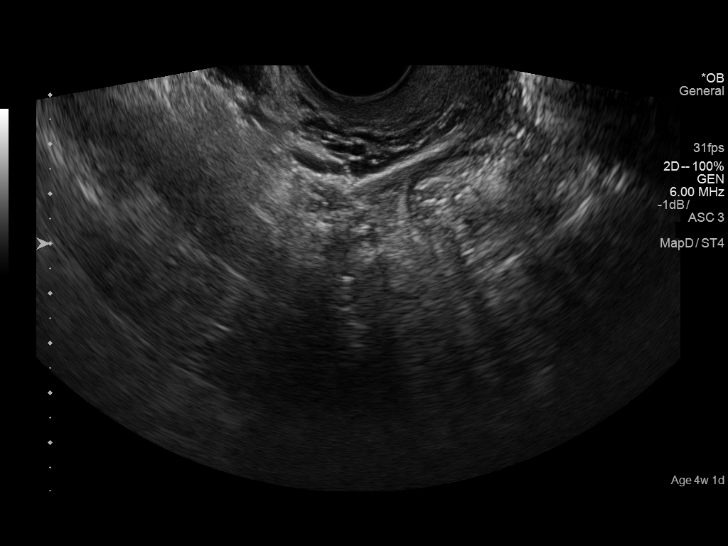

[13 of 28 positions shown; findings below may reference images not displayed]

FINDINGS: Intrauterine gestational sac: Questionable tiny gestational sac
versus minimal endometrial fluid

Yolk sac:  N/A

Embryo:  N/A

Cardiac Activity: N/A

Heart Rate: N/A  bpm

MSD: 1.9 mm greatest size  mm   CRL:    mm

Subchorionic hemorrhage:  None

Maternal uterus/adnexae:

RIGHT ovary normal size and morphology 2.3 x 1.6 x 1.3 cm.

LEFT ovary measures 2.8 x 2.7 x 2.8 cm and contains a small
hemorrhagic corpus luteal cyst.

Small amount of nonspecific free pelvic fluid.

No additional adnexal masses.
IMPRESSION: Questionable tiny gestational sac versus minimal endometrial fluid
within uterus.

In the absence of a definitive intrauterine pregnancy, ectopic
pregnancy cannot be excluded.

Recommend followup ultrasound and/or potentially serial quantitative
beta HCG to definitively exclude ectopic pregnancy.

## 2017-09-15 ENCOUNTER — Ambulatory Visit (HOSPITAL_COMMUNITY)
Admission: EM | Admit: 2017-09-15 | Discharge: 2017-09-15 | Disposition: A | Discharge status: Home / Self Care | Payer: Self-pay | Attending: Family Medicine | Admitting: Family Medicine

## 2017-09-15 ENCOUNTER — Other Ambulatory Visit: Payer: Self-pay

## 2017-09-15 ENCOUNTER — Encounter (HOSPITAL_COMMUNITY): Payer: Self-pay | Admitting: Emergency Medicine

## 2017-09-15 DIAGNOSIS — Z8619 Personal history of other infectious and parasitic diseases: Secondary | ICD-10-CM | POA: Insufficient documentation

## 2017-09-15 DIAGNOSIS — Z3202 Encounter for pregnancy test, result negative: Secondary | ICD-10-CM

## 2017-09-15 DIAGNOSIS — Z8249 Family history of ischemic heart disease and other diseases of the circulatory system: Secondary | ICD-10-CM | POA: Insufficient documentation

## 2017-09-15 DIAGNOSIS — Z813 Family history of other psychoactive substance abuse and dependence: Secondary | ICD-10-CM | POA: Insufficient documentation

## 2017-09-15 DIAGNOSIS — Z113 Encounter for screening for infections with a predominantly sexual mode of transmission: Secondary | ICD-10-CM

## 2017-09-15 DIAGNOSIS — Z202 Contact with and (suspected) exposure to infections with a predominantly sexual mode of transmission: Secondary | ICD-10-CM | POA: Insufficient documentation

## 2017-09-15 LAB — POCT URINALYSIS DIP (DEVICE)
BILIRUBIN URINE: NEGATIVE
GLUCOSE, UA: NEGATIVE mg/dL
HGB URINE DIPSTICK: NEGATIVE
Ketones, ur: NEGATIVE mg/dL
LEUKOCYTES UA: NEGATIVE
NITRITE: NEGATIVE
Protein, ur: NEGATIVE mg/dL
Specific Gravity, Urine: 1.025 (ref 1.005–1.030)
UROBILINOGEN UA: 0.2 mg/dL (ref 0.0–1.0)
pH: 7 (ref 5.0–8.0)

## 2017-09-15 LAB — POCT PREGNANCY, URINE: PREG TEST UR: NEGATIVE

## 2017-09-15 MED ORDER — AZITHROMYCIN 250 MG PO TABS
1000.0000 mg | ORAL_TABLET | Freq: Once | ORAL | Status: AC
  Administered 2017-09-15: 1000 mg via ORAL

## 2017-09-15 MED ORDER — AZITHROMYCIN 250 MG PO TABS
ORAL_TABLET | ORAL | Status: AC
  Filled 2017-09-15: qty 4

## 2017-09-15 NOTE — ED Triage Notes (Addendum)
Pt had sex one time with a guy in October.  Some other girl that she knows just told her today that she tested positive for Chlamydia after being with the same guy recently.

## 2017-09-15 NOTE — Discharge Instructions (Signed)
We have treated you for chlamydia today. We are testing you for gonorrhea, chlamydia, trichomonas, yeast, and bacterial vaginosis.   We will call in a few days if anything is positive.

## 2017-09-15 NOTE — ED Notes (Signed)
Clean and dirty urine specimens obtained and specimens are in the lab

## 2017-09-15 NOTE — ED Provider Notes (Signed)
[facility]    CSN: [id number] Arrival date & time: 09/15/17  1710     History   Chief Complaint Chief Complaint  Patient presents with  . Exposure to STD    HPI Kim [name] is a 26 y.o. female presenting with concern over possible exposure to chlamydia. States she was sexually active with someone whose current partner tested positive for chlamydia. Currently sexually active with another partner. Denies symptoms of discharge, dysuria, abdominal pain, nausea, vomiting, fever. LMP 2 weeks ago. Not on birth control. States she always uses condoms.   HPI  Past Medical History:  Diagnosis Date  . Anxiety   . Asthma   . Headache   . History of chlamydia   . Infection    UTI  . Pregnancy induced hypertension     Patient Active Problem List   Diagnosis Date Noted  . Postpartum examination following cesarean delivery 11/10/2016  . Contraceptive management 11/10/2016  . Multiple body piercings 10/06/2016  . ASCUS with positive high risk HPV cervical 04/23/2016  . History of cesarean delivery 04/15/2016    Past Surgical History:  Procedure Laterality Date  . CESAREAN SECTION    . CESAREAN SECTION N/A 10/10/2016   Procedure: CESAREAN SECTION;  Surgeon: Lazaro ArmsLuther H Eure, MD;  [facility];  Service: Obstetrics;  Laterality: N/A;    OB History    Gravida Para Term Preterm AB Living   4 2 2   2 2    SAB TAB Ectopic Multiple Live Births     2   0 2      Obstetric Comments   EAB x 2 (one in clinic procedure and one medical). Scheduled c-section b/c they said her pelvis was too long and narrow. Pt states she never went into labor.        Home Medications    Prior to Admission medications   Medication Sig Start Date End Date Taking? Authorizing Provider  albuterol (PROVENTIL HFA;VENTOLIN HFA) 108 (90 BASE) MCG/ACT inhaler Inhale 2 puffs into the lungs every 6 (six) hours as needed for wheezing or shortness of breath. Patient not taking: Reported  on 11/10/2016 06/29/15   Linwood DibblesKnapp, Jon, MD  medroxyPROGESTERone (DEPO-PROVERA) 150 MG/ML injection Inject 1 mL (150 mg total) into the muscle every 3 (three) months. 11/10/16   Hermina StaggersErvin, Michael L, MD  methocarbamol (ROBAXIN) 500 MG tablet Take 1 tablet (500 mg total) by mouth 2 (two) times daily. 04/18/17   Maxwell CaulLayden, Lindsey A, PA-C  Prenatal MV-Min-FA-Omega-3 (PRENATAL GUMMIES/DHA & FA) 0.4-32.5 MG CHEW Chew 2 each by mouth daily.    [provider]    Family History Family History  Problem Relation Age of Onset  . Hypertension Mother   . Cancer Maternal Grandmother   . Drug abuse Father        died from overdose  . Stroke Neg Hx     Social History Social History   Tobacco Use  . Smoking status: Never Smoker  . Smokeless tobacco: Never Used  Substance Use Topics  . Alcohol use: Yes    Comment: not while preg  . Drug use: No     Allergies   Patient has no known allergies.   Review of Systems Review of Systems  All other systems reviewed and are negative.    Physical Exam Triage Vital Signs ED Triage Vitals [09/15/17 1732]  Enc Vitals Group     BP 129/78     Pulse Rate 73  Resp 16     Temp 98.6 F (37 C)     Temp Source Oral     SpO2 99 %     Weight      Height      Head Circumference      Peak Flow      Pain Score      Pain Loc      Pain Edu?      Excl. in GC?    No data found.  Updated Vital Signs BP 129/78 (BP Location: Right Arm)   Pulse 73   Temp 98.6 F (37 C) (Oral)   Resp 16   LMP 09/01/2017 (Approximate)   SpO2 99%   Visual Acuity Right Eye Distance:   Left Eye Distance:   Bilateral Distance:    Right Eye Near:   Left Eye Near:    Bilateral Near:     Physical Exam  Constitutional: She appears well-developed and well-nourished. No distress.  HENT:  Head: Normocephalic and atraumatic.  Eyes: Conjunctivae are normal.  Neck: Neck supple.  Cardiovascular: Normal rate and regular rhythm.  No murmur heard. Pulmonary/Chest:  Effort normal and breath sounds normal. No respiratory distress.  Abdominal: Soft. She exhibits no distension. There is no tenderness. There is no guarding.  Genitourinary:  Genitourinary Comments: Deferred as patient without symptoms  Musculoskeletal: She exhibits no edema.  Neurological: She is alert.  Skin: Skin is warm and dry.  Psychiatric: She has a normal mood and affect.  Nursing note and vitals reviewed.    UC Treatments / Results  Labs (all labs ordered are listed, but only abnormal results are displayed) Labs Reviewed  POCT URINALYSIS DIP (DEVICE)  POCT PREGNANCY, URINE  CERVICOVAGINAL ANCILLARY ONLY    EKG  EKG Interpretation None       Radiology No results found.  Procedures Procedures (including critical care time)  Medications Ordered in UC Medications  azithromycin (ZITHROMAX) tablet 1,000 mg (not administered)     Initial Impression / Assessment and Plan / UC Course  I have reviewed the triage vital signs and the nursing notes.  Pertinent labs & imaging results that were available during my care of the patient were reviewed by me and considered in my medical decision making (see chart for details).     Patient requested empiric treatment for chlamydia. 1000 mg Azithromycin given. Urine negative. Vaginal swab for GC/Chlamydia/ Trich/ Candida/BV. Will call with results.  Discussed return precautions. Patient verbalized understanding and is agreeable with plan.   Final Clinical Impressions(s) / UC Diagnoses   Final diagnoses:  STD exposure    ED Discharge Orders    None       Controlled Substance Prescriptions  Controlled Substance Registry consulted? Not Applicable   Lew DawesWieters, Hallie C, [place]PA-C 09/15/17 [phone]

## 2017-09-15 NOTE — ED Notes (Signed)
Call back number verified and updated in EPIC... Adv pt to not have SI until lab results comeback neg.... Also adv pt lab results will be on MyChart; instructions given .... Pt verb understanding.   

## 2017-09-30 ENCOUNTER — Telehealth (HOSPITAL_COMMUNITY): Payer: Self-pay | Admitting: *Deleted

## 2017-09-30 LAB — CERVICOVAGINAL ANCILLARY ONLY
Bacterial vaginitis: POSITIVE — AB
CANDIDA VAGINITIS: NEGATIVE
Chlamydia: NEGATIVE
Neisseria Gonorrhea: NEGATIVE
TRICH (WINDOWPATH): NEGATIVE

## 2018-02-02 ENCOUNTER — Encounter (HOSPITAL_COMMUNITY): Payer: Self-pay | Admitting: Emergency Medicine

## 2018-02-02 ENCOUNTER — Ambulatory Visit (HOSPITAL_COMMUNITY)
Admission: EM | Admit: 2018-02-02 | Discharge: 2018-02-02 | Disposition: A | Discharge status: Home / Self Care | Payer: Self-pay | Attending: Family Medicine | Admitting: Family Medicine

## 2018-02-02 DIAGNOSIS — N390 Urinary tract infection, site not specified: Secondary | ICD-10-CM

## 2018-02-02 DIAGNOSIS — J45909 Unspecified asthma, uncomplicated: Secondary | ICD-10-CM | POA: Insufficient documentation

## 2018-02-02 DIAGNOSIS — R300 Dysuria: Secondary | ICD-10-CM

## 2018-02-02 DIAGNOSIS — N898 Other specified noninflammatory disorders of vagina: Secondary | ICD-10-CM | POA: Insufficient documentation

## 2018-02-02 DIAGNOSIS — Z3202 Encounter for pregnancy test, result negative: Secondary | ICD-10-CM

## 2018-02-02 DIAGNOSIS — Z79899 Other long term (current) drug therapy: Secondary | ICD-10-CM | POA: Insufficient documentation

## 2018-02-02 DIAGNOSIS — Z8249 Family history of ischemic heart disease and other diseases of the circulatory system: Secondary | ICD-10-CM | POA: Insufficient documentation

## 2018-02-02 LAB — POCT URINALYSIS DIP (DEVICE)
Bilirubin Urine: NEGATIVE
GLUCOSE, UA: NEGATIVE mg/dL
Hgb urine dipstick: NEGATIVE
Ketones, ur: NEGATIVE mg/dL
NITRITE: POSITIVE — AB
Protein, ur: NEGATIVE mg/dL
SPECIFIC GRAVITY, URINE: 1.02 (ref 1.005–1.030)
UROBILINOGEN UA: 1 mg/dL (ref 0.0–1.0)
pH: 7 (ref 5.0–8.0)

## 2018-02-02 LAB — POCT PREGNANCY, URINE: PREG TEST UR: NEGATIVE

## 2018-02-02 MED ORDER — NITROFURANTOIN MONOHYD MACRO 100 MG PO CAPS: 100.0000 mg | ORAL_CAPSULE | Freq: Two times a day (BID) | ORAL | 0 refills | Status: AC

## 2018-02-02 NOTE — ED Triage Notes (Signed)
Pt c/o pain "in my cervix" pt also c/o painful urination.

## 2018-02-02 NOTE — ED Provider Notes (Signed)
[facility]    CSN: [id number] Arrival date & time: 5/9/[id number]     History   Chief Complaint Chief Complaint  Patient presents with  . Pelvic Pain  . Dysuria    HPI Kim [name] is a 27 y.o. female.   nakeia presents with complaints of pain with urination as well as low back pain which she feels started approximately 2 weeks ago as well as some right lower pelvic pain. Pain with intercourse. Denies vaginal itching, discharge, burning or bleeding. LMP was two weeks ago. Denies concerns for STD's, has 1 partner and does not use condoms. Is not on birth control. States she can feel a "lump under the skin" to pubic region. She does shave pubic hair. Hx UTI, chlamydia, ASCUS   ROS per HPI.      Past Medical History:  Diagnosis Date  . Anxiety   . Asthma   . Headache   . History of chlamydia   . Infection    UTI  . Pregnancy induced hypertension     Patient Active Problem List   Diagnosis Date Noted  . Postpartum examination following cesarean delivery 11/10/2016  . Contraceptive management 11/10/2016  . Multiple body piercings 10/06/2016  . ASCUS with positive high risk HPV cervical 04/23/2016  . History of cesarean delivery 04/15/2016    Past Surgical History:  Procedure Laterality Date  . CESAREAN SECTION    . CESAREAN SECTION N/A 10/10/2016   Procedure: CESAREAN SECTION;  Surgeon: Lazaro Arms, MD;  Location: [facility] BIRTHING SUITES;  Service: Obstetrics;  Laterality: N/A;    OB History    Gravida  4   Para  2   Term  2   Preterm      AB  2   Living  2     SAB      TAB  2   Ectopic      Multiple  0   Live Births  2        Obstetric Comments  EAB x 2 (one in clinic procedure and one medical). Scheduled c-section b/c they said her pelvis was too long and narrow. Pt states she never went into labor.          Home Medications    Prior to Admission medications   Medication Sig Start Date End Date Taking? Authorizing  Provider  albuterol (PROVENTIL HFA;VENTOLIN HFA) 108 (90 BASE) MCG/ACT inhaler Inhale 2 puffs into the lungs every 6 (six) hours as needed for wheezing or shortness of breath. Patient not taking: Reported on 11/10/2016 06/29/15   Linwood Dibbles, MD  medroxyPROGESTERone (DEPO-PROVERA) 150 MG/ML injection Inject 1 mL (150 mg total) into the muscle every 3 (three) months. Patient not taking: Reported on 02/02/2018 11/10/16   Hermina Staggers, MD  methocarbamol (ROBAXIN) 500 MG tablet Take 1 tablet (500 mg total) by mouth 2 (two) times daily. Patient not taking: Reported on 02/02/2018 04/18/17   Maxwell Caul, PA-C  nitrofurantoin, macrocrystal-monohydrate, (MACROBID) 100 MG capsule Take 1 capsule (100 mg total) by mouth 2 (two) times daily for 5 days. 02/02/18 02/07/18  Georgetta Haber, NP  Prenatal MV-Min-FA-Omega-3 (PRENATAL GUMMIES/DHA & FA) 0.4-32.5 MG CHEW Chew 2 each by mouth daily.    [provider]    Family History Family History  Problem Relation Age of Onset  . Hypertension Mother   . Cancer Maternal Grandmother   . Drug abuse Father        died from  overdose  . Stroke Neg Hx     Social History Social History   Tobacco Use  . Smoking status: Never Smoker  . Smokeless tobacco: Never Used  Substance Use Topics  . Alcohol use: Yes    Comment: not while preg  . Drug use: No     Allergies   Patient has no known allergies.   Review of Systems Review of Systems   Physical Exam Triage Vital Signs ED Triage Vitals [02/02/18 1930]  Enc Vitals Group     BP (!) 118/59     Pulse Rate 79     Resp 18     Temp 98.4 F (36.9 C)     Temp src      SpO2 100 %     Weight      Height      Head Circumference      Peak Flow      Pain Score      Pain Loc      Pain Edu?      Excl. in GC?    No data found.  Updated Vital Signs BP (!) 118/59   Pulse 79   Temp 98.4 F (36.9 C)   Resp 18   LMP 01/19/2018   SpO2 100%   Visual Acuity Right Eye Distance:   Left Eye  Distance:   Bilateral Distance:    Right Eye Near:   Left Eye Near:    Bilateral Near:     Physical Exam  Constitutional: She is oriented to person, place, and time. She appears well-developed and well-nourished. No distress.  Cardiovascular: Normal rate, regular rhythm and normal heart sounds.  Pulmonary/Chest: Effort normal and breath sounds normal.  Abdominal: Soft. There is tenderness in the suprapubic area.  Genitourinary: There is no rash, tenderness or lesion on the right labia. There is no rash, tenderness or lesion on the left labia. Cervix exhibits discharge. Right adnexum displays tenderness. Right adnexum displays no mass and no fullness. Left adnexum displays no mass, no tenderness and no fullness. Vaginal discharge found.  Genitourinary Comments: Patient indicates feeling a "lump" to pubic region- no palpable abscess, cyst, mass present; without any redness or folliculitis present; thick white vaginal discharge; mild right adenexal tenderness on exam  Neurological: She is alert and oriented to person, place, and time.  Skin: Skin is warm and dry.     UC Treatments / Results  Labs (all labs ordered are listed, but only abnormal results are displayed) Labs Reviewed  POCT URINALYSIS DIP (DEVICE) - Abnormal; Notable for the following components:      Result Value   Nitrite POSITIVE (*)    Leukocytes, UA TRACE (*)    All other components within normal limits  URINE CULTURE  POCT PREGNANCY, URINE  CERVICOVAGINAL ANCILLARY ONLY    EKG None  Radiology No results found.  Procedures Procedures (including critical care time)  Medications Ordered in UC Medications - No data to display  Initial Impression / Assessment and Plan / UC Course  I have reviewed the triage vital signs and the nursing notes.  Pertinent labs & imaging results that were available during my care of the patient were reviewed by me and considered in my medical decision making (see chart for  details).     Non specific pelvic exam, cytology in process. Will not treat vaginal discharge as patient is currently asymptomatic. Mild adenexal tenderness on exam in context of UTI. Antibiotics for UTI initiated at this time.  Will notify of any positive findings and if any changes to treatment are needed from vaginal sampling. Encouraged follow up with gynecology if symptoms persist. Return precautions provided. Patient verbalized understanding and agreeable to plan.     Final Clinical Impressions(s) / UC Diagnoses   Final diagnoses:  Urinary tract infection without hematuria, site unspecified     Discharge Instructions     Increase water intake to empty bladder regularly. Avoid alcohol and caffeine as these can be irritating to the bladder. Complete course of antibiotics.   We have sent for testing from your vagina- Will notify you of any positive findings and if any changes to treatment are needed.   If symptoms worsen or do not improve in the next week to return to be seen or to follow up with your PCP or gynecologist.     ED Prescriptions    Medication Sig Dispense Auth. Provider   nitrofurantoin, macrocrystal-monohydrate, (MACROBID) 100 MG capsule Take 1 capsule (100 mg total) by mouth 2 (two) times daily for 5 days. 10 capsule Georgetta Haber, NP     Controlled Substance Prescriptions [place] Controlled Substance Registry consulted? Not Applicable   Georgetta Haber, NP 02/02/18 2013

## 2018-02-02 NOTE — Discharge Instructions (Signed)
Increase water intake to empty bladder regularly. Avoid alcohol and caffeine as these can be irritating to the bladder. Complete course of antibiotics.   We have sent for testing from your vagina- Will notify you of any positive findings and if any changes to treatment are needed.   If symptoms worsen or do not improve in the next week to return to be seen or to follow up with your PCP or gynecologist.

## 2018-02-05 LAB — URINE CULTURE: Culture: >=100000 — AB

## 2018-02-06 ENCOUNTER — Telehealth (HOSPITAL_COMMUNITY): Payer: Self-pay

## 2018-02-06 LAB — CERVICOVAGINAL ANCILLARY ONLY
BACTERIAL VAGINITIS: POSITIVE — AB
CANDIDA VAGINITIS: NEGATIVE
CHLAMYDIA, DNA PROBE: NEGATIVE
Neisseria Gonorrhea: NEGATIVE
TRICH (WINDOWPATH): POSITIVE — AB

## 2018-02-06 MED ORDER — METRONIDAZOLE 500 MG PO TABS: 500.0000 mg | ORAL_TABLET | Freq: Two times a day (BID) | ORAL | 0 refills | Status: DC

## 2018-02-06 NOTE — Telephone Encounter (Signed)
Per pt chart she is still breast feeding, I need to speak with the accuracy of this and discuss treatment options while lactating, will discontinue flagyl until I have spoke to the patient. Attempted to reach patient x 2.

## 2018-02-06 NOTE — Telephone Encounter (Signed)
Trichomonas is positive. Rx metronidazole  bid x 7d #14 no refills was sent to the pharmacy of record. Attempted to reach patient, no answer at this time. Need to educate patient to refrain from sexual intercourse for 7 days to give the medicine time to work. Sexual partners need to be notified and tested/treated. Condoms may reduce risk of reinfection.  Recheck for further evaluation if symptoms are not improving. Also, BV is positive this will also be treated with Flagyl sent to pharmacy.

## 2018-02-07 ENCOUNTER — Telehealth (HOSPITAL_COMMUNITY): Payer: Self-pay

## 2018-02-07 MED ORDER — METRONIDAZOLE 500 MG PO TABS: 500.0000 mg | ORAL_TABLET | Freq: Two times a day (BID) | ORAL | 0 refills | Status: DC

## 2018-02-07 NOTE — Telephone Encounter (Signed)
Attempted to reach patient x 2 regarding results and to assess lactating status for treatment. No answer at this time. Voicemail left.

## 2018-02-07 NOTE — Telephone Encounter (Signed)
Pt returned my phone call and reports not breast feeding . Rx sent to pharmacy of record for Flagyl 500 mg BID x 7 days.

## 2019-04-23 ENCOUNTER — Other Ambulatory Visit: Payer: Self-pay

## 2019-04-23 ENCOUNTER — Emergency Department (HOSPITAL_COMMUNITY)
Admission: EM | Admit: 2019-04-23 | Discharge: 2019-04-24 | Disposition: A | Discharge status: Home / Self Care | Payer: Self-pay | Attending: Emergency Medicine | Admitting: Emergency Medicine

## 2019-04-23 ENCOUNTER — Encounter (HOSPITAL_COMMUNITY): Payer: Self-pay | Admitting: Emergency Medicine

## 2019-04-23 DIAGNOSIS — J45909 Unspecified asthma, uncomplicated: Secondary | ICD-10-CM | POA: Insufficient documentation

## 2019-04-23 DIAGNOSIS — O99351 Diseases of the nervous system complicating pregnancy, first trimester: Secondary | ICD-10-CM | POA: Insufficient documentation

## 2019-04-23 DIAGNOSIS — Z79899 Other long term (current) drug therapy: Secondary | ICD-10-CM | POA: Insufficient documentation

## 2019-04-23 DIAGNOSIS — O0489 (Induced) termination of pregnancy with other complications: Secondary | ICD-10-CM | POA: Insufficient documentation

## 2019-04-23 DIAGNOSIS — Z3A00 Weeks of gestation of pregnancy not specified: Secondary | ICD-10-CM | POA: Insufficient documentation

## 2019-04-23 DIAGNOSIS — O99511 Diseases of the respiratory system complicating pregnancy, first trimester: Secondary | ICD-10-CM | POA: Insufficient documentation

## 2019-04-23 DIAGNOSIS — Z3201 Encounter for pregnancy test, result positive: Secondary | ICD-10-CM | POA: Insufficient documentation

## 2019-04-23 DIAGNOSIS — G43009 Migraine without aura, not intractable, without status migrainosus: Secondary | ICD-10-CM | POA: Insufficient documentation

## 2019-04-23 NOTE — ED Triage Notes (Addendum)
C/o headache x 3 days with history of same.  Taking OTC meds without relief.  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  No neuro deficits.

## 2019-04-24 LAB — I-STAT BETA HCG BLOOD, ED (MC, WL, AP ONLY): I-stat hCG, quantitative: 149.7 m[IU]/mL — ABNORMAL HIGH (ref <5)

## 2019-04-24 MED ORDER — DIPHENHYDRAMINE HCL 25 MG PO CAPS
25.0000 mg | ORAL_CAPSULE | Freq: Once | ORAL | Status: AC
  Administered 2019-04-24: 25 mg via ORAL
  Filled 2019-04-24: qty 1

## 2019-04-24 MED ORDER — KETOROLAC TROMETHAMINE 60 MG/2ML IM SOLN
30.0000 mg | Freq: Once | INTRAMUSCULAR | Status: AC
  Administered 2019-04-24: 30 mg via INTRAMUSCULAR
  Filled 2019-04-24: qty 2

## 2019-04-24 MED ORDER — PROCHLORPERAZINE MALEATE 5 MG PO TABS
10.0000 mg | ORAL_TABLET | Freq: Once | ORAL | Status: AC
  Administered 2019-04-24: 10 mg via ORAL
  Filled 2019-04-24: qty 2

## 2019-04-24 MED ORDER — ACETAMINOPHEN 500 MG PO TABS
1000.0000 mg | ORAL_TABLET | Freq: Once | ORAL | Status: AC
  Administered 2019-04-24: 09:00:00 1000 mg via ORAL
  Filled 2019-04-24: qty 2

## 2019-04-24 NOTE — ED Provider Notes (Signed)
I have personally seen and examined the patient. I have reviewed the documentation on PMH/FH/Soc Hx. I have discussed the plan of care with the resident and patient.  I have reviewed and agree with the resident's documentation. Please see associated encounter note.  Briefly, the patient is a 28 y.o. female here with migraine headache.  Patient has history of weekly headaches.  This headache has been on and off for the last 3 days.  Like her usual headaches.  No concern for meningitis.  Patient with increased anxiety as she recently had an abortion about 2 weeks ago when she was about [redacted] weeks pregnant.  Patient does not take any medicine for her headaches.  She is neurologically intact.  No concern for intracranial process.  This appears typical of migraine.  Normal vitals.  No fever.  Pregnancy test shows of 149 which is likely decreasing.  She understands to follow-up with OB/GYN to follow hCG to completion 0.  Does not have any abdominal pain or vaginal bleeding.  Patient was given headache cocktail and discharged in the ED in good condition.  Given information to follow-up with neurology as she may benefit from preventative headache medication.  This chart was dictated using voice recognition software.  Despite best efforts to proofread,  errors can occur which can change the documentation meaning.     EKG Interpretation None         Lennice Sites, DO 04/24/19 0845

## 2019-04-24 NOTE — ED Provider Notes (Signed)
[facility]   CSN: [id number] Arrival date & time: 04/23/19  2301    History   Chief Complaint Chief Complaint  Patient presents with  . Migraine    HPI Kim [name] is a 28 y.o. female.     HPI Presents for evaluation of headache.  Reports that it has been persistent over the last 3 days, waxing and waning, sharp global pressure in character.  States that she has headaches like this weekly without clear alleviating factors.  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  No neurologic deficits.  No recent history of trauma.  No regular medication use.  Gets regular sleep.  States vision her left eye is blurry compared to right and reports that in nursing school 2 weeks ago they dilated the pupil and told her her left pupil did not dilate as much as the right.  Has not seen an eye doctor.  Also states that she was [redacted] weeks pregnant [redacted] weeks ago and underwent medication induced elective abortion.  Had spotting for a week with passage of clots followed by several days without vaginal bleeding.  Has been spotting again over the last 2 days.  Past Medical History:  Diagnosis Date  . Anxiety   . Asthma   . Headache   . History of chlamydia   . Infection    UTI  . Pregnancy induced hypertension     Patient Active Problem List   Diagnosis Date Noted  . Postpartum examination following cesarean delivery 11/10/2016  . Contraceptive management 11/10/2016  . Multiple body piercings 10/06/2016  . ASCUS with positive high risk HPV cervical 04/23/2016  . History of cesarean delivery 04/15/2016    Past Surgical History:  Procedure Laterality Date  . CESAREAN SECTION    . CESAREAN SECTION N/A 10/10/2016   Procedure: CESAREAN SECTION;  Surgeon: Lazaro ArmsLuther H Eure, MD;  Location: [facility];  Service: Obstetrics;  Laterality: N/A;     OB History    Gravida  4   Para  2   Term  2   Preterm      AB  2   Living  2     SAB      TAB  2   Ectopic      Multiple  0   Live Births  2        Obstetric Comments  EAB x 2 (one in clinic procedure and one medical). Scheduled c-section b/c they said her pelvis was too long and narrow. Pt states she never went into labor.          Home Medications    Prior to Admission medications   Medication Sig Start Date End Date Taking? Authorizing Provider  albuterol (PROVENTIL HFA;VENTOLIN HFA) 108 (90 BASE) MCG/ACT inhaler Inhale 2 puffs into the lungs every 6 (six) hours as needed for wheezing or shortness of breath. Patient not taking: Reported on 11/10/2016 06/29/15   Linwood DibblesKnapp, Jon, MD  medroxyPROGESTERone (DEPO-PROVERA) 150 MG/ML injection Inject 1 mL (150 mg total) into the muscle every 3 (three) months. Patient not taking: Reported on 02/02/2018 11/10/16   Hermina StaggersErvin, Michael L, MD  methocarbamol (ROBAXIN) 500 MG tablet Take 1 tablet (500 mg total) by mouth 2 (two) times daily. Patient not taking: Reported on 02/02/2018 04/18/17   Graciella FreerLayden, Lindsey A, PA-C  metroNIDAZOLE (FLAGYL) 500 MG tablet Take 1 tablet (500 mg total) by mouth  2 (two) times daily. 02/07/18   Isa RankinMurray, Laura [name], MD  Prenatal MV-Min-FA-Omega-3 (PRENATAL GUMMIES/DHA & FA) 0.4-32.5 MG CHEW Chew 2 each by mouth daily.    [provider]    Family History Family History  Problem Relation Age of Onset  . Hypertension Mother   . Cancer Maternal Grandmother   . Drug abuse Father        died from overdose  . Stroke Neg Hx     Social History Social History   Tobacco Use  . Smoking status: Never Smoker  . Smokeless tobacco: Never Used  Substance Use Topics  . Alcohol use: Yes    Comment: not while preg  . Drug use: No     Allergies   Patient has no known allergies.   Review of Systems Review of Systems  Constitutional: Negative for chills and fever.  HENT: Negative for ear pain and sore throat.   Eyes: Negative for pain.  Respiratory:  Negative for cough and shortness of breath.   Cardiovascular: Negative for chest pain and palpitations.  Gastrointestinal: Negative for abdominal pain and vomiting.  Genitourinary: Positive for vaginal bleeding. Negative for dysuria, hematuria and pelvic pain.  Musculoskeletal: Negative for arthralgias and back pain.  Skin: Negative for color change and rash.  Neurological: Positive for headaches. Negative for seizures, syncope and numbness.  Hematological: Does not bruise/bleed easily.  All other systems reviewed and are negative.    Physical Exam Updated Vital Signs BP 131/85   Pulse 64   Temp 98.9 F (37.2 C) (Oral)   Resp 16   LMP 04/21/2019   SpO2 100%   Physical Exam Vitals signs and nursing note reviewed.  Constitutional:      General: She is not in acute distress.    Appearance: She is well-developed.  HENT:     Head: Normocephalic and atraumatic.  Eyes:     Extraocular Movements: Extraocular movements intact.     Conjunctiva/sclera: Conjunctivae normal.     Pupils: Pupils are equal, round, and reactive to light.  Neck:     Musculoskeletal: Neck supple.  Cardiovascular:     Rate and Rhythm: Normal rate and regular rhythm.     Heart sounds: No murmur.  Pulmonary:     Effort: Pulmonary effort is normal. No respiratory distress.     Breath sounds: Normal breath sounds.  Abdominal:     Palpations: Abdomen is soft.     Tenderness: There is no abdominal tenderness.  Skin:    General: Skin is warm and dry.  Neurological:     General: No focal deficit present.     Mental Status: She is alert.     Comments: No dysmetria, dysarthria, or visual field deficit.  Full and equal bilateral strength and sensation throughout.      ED Treatments / Results  Labs (all labs ordered are listed, but only abnormal results are displayed) Labs Reviewed  I-STAT BETA HCG BLOOD, ED ([facility], [facility], [facility] ONLY) - Abnormal; Notable for the following components:      Result Value   I-stat  hCG, quantitative 149.7 (*)    All other components within normal limits    EKG None  Radiology No results found.  Procedures Procedures (including critical care time)  Medications Ordered in ED Medications  acetaminophen (TYLENOL) tablet 1,000 mg (has no administration in time range)  ketorolac (TORADOL) injection 30 mg (has no administration in time range)  prochlorperazine (COMPAZINE) tablet 10 mg (has no administration in time  range)  diphenhydrAMINE (BENADRYL) capsule 25 mg (has no administration in time range)     Initial Impression / Assessment and Plan / ED Course  I have reviewed the triage vital signs and the nursing notes.  Pertinent labs & imaging results that were available during my care of the patient were reviewed by me and considered in my medical decision making (see chart for details).        Patient presents with headache that is consistent with previous.  Just reports that it is a longer duration but also notes is waxing waning.  There is no temporal pattern to suggest idiopathic intracranial hypertension.  No focal neurologic deficits to suggest intracranial mass or bleed.  She does report recent abortive therapy.  Beta-hCG is positive now but given the time course that she reports, lack of abdominal pain, and reassuring abdominal exam I have low suspicion for viable intrauterine pregnancy and patient vocalized understanding of the risks of medications ordered in pregnancy and is okay with therapeutic plan given concurrent plan for abortion.  With her positive hCG I did emphasize the importance of following up with OB within the next 2 days for reevaluation and trending of hCG.  Administer Tylenol, Toradol, Compazine, and Benadryl for symptomatic treatment and instructed her to return home, drink a liter of water and rest this afternoon.  Provided referral to establish care with neurology as well given the long-term history of headache.  Patient vocalized  understanding and agreement with plan. Hand no other questions or concerns. Was given relevant verbal and written information regarding aftercare and return precautions and discharged in good condition.    Final Clinical Impressions(s) / ED Diagnoses   Final diagnoses:  Migraine without aura and without status migrainosus, not intractable  Positive pregnancy test    ED Discharge Orders    None       Tillie Fantasia, MD 04/24/19 1829    Lennice Sites, DO 04/24/19 1640

## 2020-03-03 ENCOUNTER — Encounter (HOSPITAL_COMMUNITY): Payer: Self-pay | Admitting: Emergency Medicine

## 2020-03-03 ENCOUNTER — Ambulatory Visit (HOSPITAL_COMMUNITY)
Admission: EM | Admit: 2020-03-03 | Discharge: 2020-03-03 | Disposition: A | Discharge status: Home / Self Care | Payer: Self-pay | Attending: Family Medicine | Admitting: Family Medicine

## 2020-03-03 ENCOUNTER — Other Ambulatory Visit: Payer: Self-pay

## 2020-03-03 DIAGNOSIS — Z113 Encounter for screening for infections with a predominantly sexual mode of transmission: Secondary | ICD-10-CM | POA: Insufficient documentation

## 2020-03-03 DIAGNOSIS — R102 Pelvic and perineal pain: Secondary | ICD-10-CM | POA: Insufficient documentation

## 2020-03-03 DIAGNOSIS — Z3202 Encounter for pregnancy test, result negative: Secondary | ICD-10-CM

## 2020-03-03 LAB — POCT URINALYSIS DIP (DEVICE)
Bilirubin Urine: NEGATIVE
Glucose, UA: NEGATIVE mg/dL
Hgb urine dipstick: NEGATIVE
Ketones, ur: NEGATIVE mg/dL
Nitrite: NEGATIVE
Protein, ur: NEGATIVE mg/dL
Specific Gravity, Urine: 1.025 (ref 1.005–1.030)
Urobilinogen, UA: 0.2 mg/dL (ref 0.0–1.0)
pH: 7.5 (ref 5.0–8.0)

## 2020-03-03 LAB — POC URINE PREG, ED: Preg Test, Ur: NEGATIVE

## 2020-03-03 NOTE — ED Triage Notes (Signed)
Pt c/o pelvic pain x 4 days ago. Pt states last period was about 2 weeks ago. Pt denies any urinary symptoms. She states she has had some nausea. Pt states she also has felt fatigued.

## 2020-03-03 NOTE — Discharge Instructions (Addendum)
Pregnancy test is negative   UA negative  Your STD tests are pending.  If your test results are positive, we will call you.  You may need additional treatment and your partner(s) may also need treatment.

## 2020-03-04 ENCOUNTER — Telehealth (HOSPITAL_COMMUNITY): Payer: Self-pay | Admitting: Orthopedic Surgery

## 2020-03-04 LAB — CERVICOVAGINAL ANCILLARY ONLY
Bacterial Vaginitis (gardnerella): POSITIVE — AB
Candida Glabrata: NEGATIVE
Candida Vaginitis: NEGATIVE
Chlamydia: NEGATIVE
Comment: NEGATIVE
Comment: NEGATIVE
Comment: NEGATIVE
Comment: NEGATIVE
Comment: NEGATIVE
Comment: NORMAL
Neisseria Gonorrhea: NEGATIVE
Trichomonas: POSITIVE — AB

## 2020-03-04 NOTE — ED Provider Notes (Signed)
[facility]   [id number] 03/03/20 Arrival Time: 1554   CC: VAGINAL DISCHARGE  SUBJECTIVE:  Kim [name] is a 29 y.o. female who presents with complaints of gradual pelvic pain, fatigue and nausea. She denies a precipitating event or recent antibiotic use. Patient is sexually active with 1 female partner. She has not tried OTC medications. There are no aggravating or relieving factors. She denies fever, chills, nausea, vomiting, abdominal pain, urinary symptoms, vaginal itching, vaginal odor, vaginal bleeding, dyspareunia, vaginal rashes or lesions.   No LMP recorded. LMP 02/11/20 Current birth control method: none Compliant with BC:  ROS: As per HPI.  All other pertinent ROS negative.     Past Medical History:  Diagnosis Date   Anxiety    Asthma    Headache    History of chlamydia    Infection    UTI   Pregnancy induced hypertension    Past Surgical History:  Procedure Laterality Date   CESAREAN SECTION     CESAREAN SECTION N/A 10/10/2016   Procedure: CESAREAN SECTION;  Surgeon: Lazaro Arms, MD;  Location: [facility] BIRTHING SUITES;  Service: Obstetrics;  Laterality: N/A;   No Known Allergies No current facility-administered medications on file prior to encounter.   Current Outpatient Medications on File Prior to Encounter  Medication Sig Dispense Refill   albuterol (PROVENTIL HFA;VENTOLIN HFA) 108 (90 BASE) MCG/ACT inhaler Inhale 2 puffs into the lungs every 6 (six) hours as needed for wheezing or shortness of breath. (Patient not taking: Reported on 11/10/2016) 1 Inhaler 2   medroxyPROGESTERone (DEPO-PROVERA) 150 MG/ML injection Inject 1 mL (150 mg total) into the muscle every 3 (three) months. (Patient not taking: Reported on 02/02/2018) 1 mL 0   methocarbamol (ROBAXIN) 500 MG tablet Take 1 tablet (500 mg total) by mouth 2 (two) times daily. (Patient not taking: Reported on 02/02/2018) 15 tablet 0   metroNIDAZOLE (FLAGYL) 500 MG tablet Take 1 tablet (500 mg  total) by mouth 2 (two) times daily. 14 tablet 0   Prenatal MV-Min-FA-Omega-3 (PRENATAL GUMMIES/DHA & FA) 0.4-32.5 MG CHEW Chew 2 each by mouth daily.      Social History   Socioeconomic History   Marital status: Single    Spouse name: Not on file   Number of children: Not on file   Years of education: Not on file   Highest education level: Not on file  Occupational History   Not on file  Tobacco Use   Smoking status: Never Smoker   Smokeless tobacco: Never Used  Substance and Sexual Activity   Alcohol use: Yes    Comment: not while preg   Drug use: No   Sexual activity: Not Currently    Birth control/protection: None    Comment: last sex early May 2017  Other Topics Concern   Not on file  Social History Narrative   Not on file   Social Determinants of Health   Financial Resource Strain:    Difficulty of Paying Living Expenses:   Food Insecurity:    Worried About Programme researcher, broadcasting/film/video in the Last Year:    Barista in the Last Year:   Transportation Needs:    Freight forwarder (Medical):    Lack of Transportation (Non-Medical):   Physical Activity:    Days of Exercise per Week:    Minutes of Exercise per Session:   Stress:    Feeling of Stress :   Social Connections:    Frequency of Communication with Friends  and Family:    Frequency of Social Gatherings with Friends and Family:    Attends Religious Services:    Active Member of Clubs or Organizations:    Attends Engineer, structural:    Marital Status:   Intimate Partner Violence:    Fear of Current or Ex-Partner:    Emotionally Abused:    Physically Abused:    Sexually Abused:    Family History  Problem Relation Age of Onset   Hypertension Mother    Cancer Maternal Grandmother    Drug abuse Father        died from overdose   Stroke Neg Hx     OBJECTIVE:  Vitals:   03/03/20 1638  BP: 135/76  Pulse: 73  Resp: 14  Temp: 98.9 F (37.2 C)    TempSrc: Oral  SpO2: 97%     General appearance: Alert, NAD, appears stated age Head: NCAT Throat: lips, mucosa, and tongue normal; teeth and gums normal Lungs: CTA bilaterally without adventitious breath sounds Heart: regular rate and rhythm.  Radial pulses 2+ symmetrical bilaterally Back: no CVA tenderness Abdomen: soft, non-tender; bowel sounds normal; no masses or organomegaly; no guarding or rebound tenderness GU: External examination without vulvar lesions or erythema Bimanual exam: Negative for cervical motion or adenexal tenderness; Speculum exam: Thick white discharge during pelvic exam. Cervix visualized without erythema or lesions. Cervical swab obtained Skin: warm and dry Psychological:  Alert and cooperative. Normal mood and affect.  LABS:  Results for orders placed or performed during the hospital encounter of 03/03/20  POC urine pregnancy  Result Value Ref Range   Preg Test, Ur NEGATIVE NEGATIVE  POCT urinalysis dip (device)  Result Value Ref Range   Glucose, UA NEGATIVE NEGATIVE mg/dL   Bilirubin Urine NEGATIVE NEGATIVE   Ketones, ur NEGATIVE NEGATIVE mg/dL   Specific Gravity, Urine 1.025 1.005 - 1.030   Hgb urine dipstick NEGATIVE NEGATIVE   pH 7.5 5.0 - 8.0   Protein, ur NEGATIVE NEGATIVE mg/dL   Urobilinogen, UA 0.2 0.0 - 1.0 mg/dL   Nitrite NEGATIVE NEGATIVE   Leukocytes,Ua SMALL (A) NEGATIVE  Cervicovaginal ancillary only  Result Value Ref Range   Neisseria Gonorrhea Negative    Chlamydia Negative    Trichomonas Positive (A)    Bacterial Vaginitis (gardnerella) Positive (A)    Candida Vaginitis Negative    Candida Glabrata Negative    Comment      Normal Reference Range Bacterial Vaginosis - Negative   Comment Normal Reference Range Candida Species - Negative    Comment Normal Reference Range Candida Galbrata - Negative    Comment Normal Reference Range Trichomonas - Negative    Comment Normal Reference Ranger Chlamydia - Negative    Comment       Normal Reference Range Neisseria Gonorrhea - Negative    Labs Reviewed  POCT URINALYSIS DIP (DEVICE) - Abnormal; Notable for the following components:      Result Value   Leukocytes,Ua SMALL (*)    All other components within normal limits  CERVICOVAGINAL ANCILLARY ONLY - Abnormal; Notable for the following components:   Trichomonas Positive (*)    Bacterial Vaginitis (gardnerella) Positive (*)    All other components within normal limits  POC URINE PREG, ED    ASSESSMENT & PLAN:  1. Pelvic pain   2. Screen for STD (sexually transmitted disease)     No orders of the defined types were placed in this encounter.   Pending: Labs Reviewed  POCT URINALYSIS  DIP (DEVICE) - Abnormal; Notable for the following components:      Result Value   Leukocytes,Ua SMALL (*)    All other components within normal limits  CERVICOVAGINAL ANCILLARY ONLY - Abnormal; Notable for the following components:   Trichomonas Positive (*)    Bacterial Vaginitis (gardnerella) Positive (*)    All other components within normal limits  POC URINE PREG, ED     Pelvic Pain STD Screen UA not impressive for UTI Vaginal self-swab obtained.   We will follow up with you regarding abnormal results Declines HIV/ syphilis testing today If tests results are positive, please abstain from sexual activity until you and your partner(s) have been treated Follow up with PCP or [facility] if symptoms persists Return here or go to ER if you have any new or worsening symptoms fever, chills, nausea, vomiting, abdominal or pelvic pain, painful intercourse, vaginal discharge, vaginal bleeding, persistent symptoms despite treatment Reviewed expectations re: course of current medical issues. Questions answered. Outlined signs and symptoms indicating need for more acute intervention. Patient verbalized understanding. After Visit Summary given.        Faustino Congress, NP 03/04/20 1412

## 2020-03-05 ENCOUNTER — Telehealth (HOSPITAL_COMMUNITY): Payer: Self-pay | Admitting: Orthopedic Surgery

## 2020-03-05 MED ORDER — METRONIDAZOLE 500 MG PO TABS
500.0000 mg | ORAL_TABLET | Freq: Two times a day (BID) | ORAL | 0 refills | Status: DC
Start: 2020-03-05 — End: 2020-07-22

## 2020-07-22 ENCOUNTER — Ambulatory Visit (HOSPITAL_COMMUNITY)
Admission: EM | Admit: 2020-07-22 | Discharge: 2020-07-22 | Disposition: A | Discharge status: Home / Self Care | Payer: No Payment, Other | Attending: Psychiatry | Admitting: Psychiatry

## 2020-07-22 ENCOUNTER — Other Ambulatory Visit: Payer: Self-pay

## 2020-07-22 DIAGNOSIS — F4323 Adjustment disorder with mixed anxiety and depressed mood: Secondary | ICD-10-CM | POA: Diagnosis not present

## 2020-07-22 DIAGNOSIS — F4322 Adjustment disorder with anxiety: Secondary | ICD-10-CM

## 2020-07-22 MED ORDER — TRAZODONE HCL 50 MG PO TABS: 50.0000 mg | ORAL_TABLET | Freq: Every day | ORAL | 0 refills | Status: AC

## 2020-07-22 MED ORDER — HYDROXYZINE PAMOATE 25 MG PO CAPS: 25.0000 mg | ORAL_CAPSULE | Freq: Three times a day (TID) | ORAL | 0 refills | Status: AC | PRN
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
## 2020-07-22 NOTE — Discharge Instructions (Signed)

## 2020-07-22 NOTE — ED Notes (Signed)
Nurse Discharge Note:  D:Patient denies SI/HI/AVH at this time. Pt appears calm and cooperative, and no distress noted.  A: Pt given AVS/ Follow-Up/Prescriptions. Pt escorted to the lobby by staff.  R:  Pt States she will comply with outpatient services, and take medications as prescribed.

## 2020-07-30 ENCOUNTER — Other Ambulatory Visit: Payer: Self-pay

## 2020-07-30 ENCOUNTER — Telehealth (HOSPITAL_COMMUNITY): Payer: No Payment, Other | Admitting: Behavioral Health

## 2020-07-31 ENCOUNTER — Telehealth (HOSPITAL_COMMUNITY): Payer: No Payment, Other | Admitting: Physician Assistant
